# Patient Record
Sex: Male | Born: 1960 | Race: White | Hispanic: No | Marital: Single | Smoking: Current every day smoker
Health system: Southern US, Community
[De-identification: ages and names within clinical notes are randomized; demographics above are authoritative.]

## PROBLEM LIST (undated history)

## (undated) DIAGNOSIS — I10 Essential (primary) hypertension: Secondary | ICD-10-CM

## (undated) DIAGNOSIS — E785 Hyperlipidemia, unspecified: Secondary | ICD-10-CM

## (undated) DIAGNOSIS — I739 Peripheral vascular disease, unspecified: Secondary | ICD-10-CM

## (undated) DIAGNOSIS — E119 Type 2 diabetes mellitus without complications: Secondary | ICD-10-CM

## (undated) DIAGNOSIS — I2510 Atherosclerotic heart disease of native coronary artery without angina pectoris: Secondary | ICD-10-CM

## (undated) DIAGNOSIS — I471 Supraventricular tachycardia: Secondary | ICD-10-CM

## (undated) HISTORY — DX: Supraventricular tachycardia: I47.1

## (undated) HISTORY — DX: Hyperlipidemia, unspecified: E78.5

## (undated) HISTORY — PX: CARDIAC CATHETERIZATION: SHX172

## (undated) HISTORY — DX: Peripheral vascular disease, unspecified: I73.9

## (undated) HISTORY — PX: TONSILLECTOMY: SUR1361

## (undated) HISTORY — DX: Essential (primary) hypertension: I10

## (undated) HISTORY — DX: Type 2 diabetes mellitus without complications: E11.9

## (undated) HISTORY — DX: Atherosclerotic heart disease of native coronary artery without angina pectoris: I25.10

---

## 2004-07-30 ENCOUNTER — Encounter: Payer: Self-pay | Admitting: Cardiology

## 2008-07-29 ENCOUNTER — Encounter: Payer: Self-pay | Admitting: Cardiology

## 2008-09-15 ENCOUNTER — Encounter: Payer: Self-pay | Admitting: Cardiology

## 2008-11-03 DIAGNOSIS — R609 Edema, unspecified: Secondary | ICD-10-CM

## 2008-11-04 ENCOUNTER — Encounter: Payer: Self-pay | Admitting: Physician Assistant

## 2008-11-04 ENCOUNTER — Ambulatory Visit: Payer: Self-pay | Admitting: Cardiology

## 2008-11-04 DIAGNOSIS — I10 Essential (primary) hypertension: Secondary | ICD-10-CM

## 2008-11-04 DIAGNOSIS — I739 Peripheral vascular disease, unspecified: Secondary | ICD-10-CM

## 2008-11-04 DIAGNOSIS — E119 Type 2 diabetes mellitus without complications: Secondary | ICD-10-CM

## 2008-11-04 DIAGNOSIS — E785 Hyperlipidemia, unspecified: Secondary | ICD-10-CM

## 2008-11-07 ENCOUNTER — Encounter: Payer: Self-pay | Admitting: Cardiology

## 2008-11-07 ENCOUNTER — Encounter: Payer: Self-pay | Admitting: Physician Assistant

## 2008-11-10 ENCOUNTER — Ambulatory Visit: Payer: Self-pay | Admitting: Cardiovascular Disease

## 2008-11-10 ENCOUNTER — Inpatient Hospital Stay (HOSPITAL_BASED_OUTPATIENT_CLINIC_OR_DEPARTMENT_OTHER): Admission: RE | Admit: 2008-11-10 | Discharge: 2008-11-10 | Payer: Self-pay | Admitting: Cardiology

## 2008-11-15 ENCOUNTER — Ambulatory Visit: Payer: Self-pay | Admitting: Cardiovascular Disease

## 2008-11-15 ENCOUNTER — Observation Stay (HOSPITAL_COMMUNITY): Admission: AD | Admit: 2008-11-15 | Discharge: 2008-11-16 | Payer: Self-pay | Admitting: Cardiovascular Disease

## 2008-11-16 ENCOUNTER — Encounter: Payer: Self-pay | Admitting: Cardiology

## 2008-12-05 ENCOUNTER — Ambulatory Visit: Payer: Self-pay | Admitting: Cardiology

## 2009-09-19 ENCOUNTER — Encounter: Payer: Self-pay | Admitting: Cardiology

## 2010-04-27 NOTE — Medication Information (Signed)
Summary: RX Folder/ METOPROLOL  RX Folder/ METOPROLOL   Imported By: Dorise Hiss 09/19/2009 16:17:27  _____________________________________________________________________  External Attachment:    Type:   Image     Comment:   External Document

## 2010-06-08 ENCOUNTER — Encounter: Payer: Self-pay | Admitting: Cardiology

## 2010-06-12 NOTE — Letter (Addendum)
Summary: Medical Certification for CDL (DOT)  Medical Certification for CDL (DOT)   Imported By: Cyril Loosen, RN, BSN 06/08/2010 11:00:24  _____________________________________________________________________  External Attachment:    Type:   Image     Comment:   External Document  Appended Document: Medical Certification for CDL (DOT) Pt last seen in Sept of 2010. He was suppose to have 3 month follow up but did not schedule this appt.   Attempted to reach pt to notify that he will need office visit before this request can be completed. Pt's mailbox is full. Therefore, I was unable to leave a message. There was an option to send a number to pt's phone for call back. Office number entered requesting call back.   Appended Document: Medical Certification for CDL (DOT) Pt will be seen on Thursday, July 19, 2010 at 1:45pm. He is aware of this appt info and verbalized understanding.

## 2010-06-18 ENCOUNTER — Other Ambulatory Visit: Payer: Self-pay | Admitting: *Deleted

## 2010-06-18 DIAGNOSIS — I10 Essential (primary) hypertension: Secondary | ICD-10-CM

## 2010-06-18 MED ORDER — METOPROLOL TARTRATE 25 MG PO TABS: 25.0000 mg | ORAL_TABLET | Freq: Two times a day (BID) | ORAL | Status: DC

## 2010-06-30 LAB — CBC
HCT: 41.6 % (ref 39.0–52.0)
Hemoglobin: 14.3 g/dL (ref 13.0–17.0)
Platelets: 216 10*3/uL (ref 150–400)
RBC: 4.51 MIL/uL (ref 4.22–5.81)
RDW: 14.2 % (ref 11.5–15.5)
WBC: 5.6 10*3/uL (ref 4.0–10.5)
WBC: 6.4 10*3/uL (ref 4.0–10.5)

## 2010-06-30 LAB — BASIC METABOLIC PANEL
BUN: 5 mg/dL — ABNORMAL LOW (ref 6–23)
Calcium: 8.7 mg/dL (ref 8.4–10.5)
Creatinine, Ser: 0.8 mg/dL (ref 0.4–1.5)
GFR calc non Af Amer: >60 mL/min (ref >60)
GFR calc non Af Amer: >60 mL/min (ref >60)
Glucose, Bld: 166 mg/dL — ABNORMAL HIGH (ref 70–99)
Potassium: 4.4 mEq/L (ref 3.5–5.1)
Sodium: 136 mEq/L (ref 135–145)

## 2010-06-30 LAB — GLUCOSE, CAPILLARY
Glucose-Capillary: 136 mg/dL — ABNORMAL HIGH (ref 70–99)
Glucose-Capillary: 150 mg/dL — ABNORMAL HIGH (ref 70–99)
Glucose-Capillary: 158 mg/dL — ABNORMAL HIGH (ref 70–99)
Glucose-Capillary: 167 mg/dL — ABNORMAL HIGH (ref 70–99)

## 2010-07-18 ENCOUNTER — Encounter: Payer: Self-pay | Admitting: *Deleted

## 2010-07-19 ENCOUNTER — Encounter (INDEPENDENT_AMBULATORY_CARE_PROVIDER_SITE_OTHER): Payer: BC Managed Care – PPO | Admitting: Cardiology

## 2010-07-19 ENCOUNTER — Ambulatory Visit (INDEPENDENT_AMBULATORY_CARE_PROVIDER_SITE_OTHER): Payer: BC Managed Care – PPO | Admitting: Cardiology

## 2010-07-19 ENCOUNTER — Encounter: Payer: Self-pay | Admitting: Cardiology

## 2010-07-19 DIAGNOSIS — R0600 Dyspnea, unspecified: Secondary | ICD-10-CM | POA: Insufficient documentation

## 2010-07-19 DIAGNOSIS — F17200 Nicotine dependence, unspecified, uncomplicated: Secondary | ICD-10-CM

## 2010-07-19 DIAGNOSIS — Z720 Tobacco use: Secondary | ICD-10-CM

## 2010-07-19 DIAGNOSIS — I2510 Atherosclerotic heart disease of native coronary artery without angina pectoris: Secondary | ICD-10-CM

## 2010-07-19 DIAGNOSIS — R9430 Abnormal result of cardiovascular function study, unspecified: Secondary | ICD-10-CM | POA: Insufficient documentation

## 2010-07-19 DIAGNOSIS — R0609 Other forms of dyspnea: Secondary | ICD-10-CM

## 2010-07-19 MED ORDER — METOPROLOL TARTRATE 25 MG PO TABS: 25.0000 mg | ORAL_TABLET | Freq: Two times a day (BID) | ORAL | Status: DC

## 2010-07-19 NOTE — Assessment & Plan Note (Signed)
Edema is improved.  No change in therapy.  He will be continued on a higher dose of diuretics.

## 2010-07-19 NOTE — Progress Notes (Signed)
Pt did not bring medications to visit today and say's that all meds are the same. Patient informed to bring medication to each visit (per Carlye Grippe, LPN).   Pt's original visit notes made erroneous per Dr. Henrietta Hoover request w/new visit opened for this documentation.  The patient is seen today for followup coronary disease.  He's feeling well.  He is not having any significant chest pain.  He has no significant shortness of breath.  He received a drug-eluting stent to the ramus in August, 2010.  There is a 50% LAD lesion. Unfortunately the patient has started to smoke again.  Past Medical History  Diagnosis Date  . Dyspnea   . CAD (coronary artery disease)     50% LAD, DES ramus August, 2010  . Edema   . Ejection fraction     Normal EF at the time of catheterization 2010   Patient denies fever, chills, headache, sweats, rash, change in vision, change in hearing, chest pain, cough, nausea vomiting, urinary symptoms.  All other systems are reviewed and are negative.  Patient is oriented to person time and place.  Affect is normal.  Head is atraumatic.  There is no xanthelasma.  Lungs are clear.  Respiratory effort is unlabored.  Cardiac exam reveals S1-S2.  No clicks or significant murmurs.  The abdomen is soft.  There is no peripheral edema.  EKG is not done today.

## 2010-07-19 NOTE — Patient Instructions (Signed)
Your physician you to follow up in 1 year. You will receive a reminder letter in the mail one-two months in advance. If you don't receive a letter, please call our office to schedule the follow-up appointment. Your physician recommends that you continue on your current medications as directed. Please refer to the Current Medication list given to you today. Your physician discussed the hazards of tobacco use. Tobacco use cessation is recommended and techniques and options to help you quit were discussed. 

## 2010-07-19 NOTE — Progress Notes (Deleted)
HPI He feels much better with increased diuretics. Creatinine remains stable at 1.8.  The patient had an abdominal ultrasound.  This study showed cholelithiasis.  There was evidence of some fatty infiltration of the liver.  There was splenomegaly Allergies  Allergen Reactions  . Penicillins     REACTION: Unknown    Current Outpatient Prescriptions  Medication Sig Dispense Refill  . ALPRAZolam (XANAX) 0.5 MG tablet Take 0.5 mg by mouth daily as needed.        Marland Kitchen amLODipine-benazepril (LOTREL) 10-20 MG per capsule Take 1 capsule by mouth daily.        Marland Kitchen aspirin 325 MG tablet Take 325 mg by mouth daily.        Marland Kitchen buPROPion (WELLBUTRIN SR) 150 MG 12 hr tablet Take 150 mg by mouth 2 (two) times daily.        . carisoprodol (SOMA) 350 MG tablet Take 350 mg by mouth 2 (two) times daily as needed.        . clopidogrel (PLAVIX) 75 MG tablet Take 75 mg by mouth daily.        Marland Kitchen docusate sodium (COLACE) 50 MG capsule Take by mouth daily.        . furosemide (LASIX) 40 MG tablet Take 40 mg by mouth daily.        Marland Kitchen HYDROcodone-acetaminophen (VICODIN) 5-500 MG per tablet Take 1 tablet by mouth 2 (two) times daily as needed.        Marland Kitchen levothyroxine (SYNTHROID, LEVOTHROID) 125 MCG tablet Take 125 mcg by mouth daily.        . metFORMIN (GLUCOPHAGE) 500 MG tablet Take 1,000 mg by mouth 2 (two) times daily with a meal.        . metoprolol tartrate (LOPRESSOR) 25 MG tablet Take 1 tablet (25 mg total) by mouth 2 (two) times daily. Please call for office visit for future refills.  30 tablet  0  . niacin (NIASPAN) 500 MG CR tablet Take 500 mg by mouth at bedtime.        . nicotine (NICODERM CQ - DOSED IN MG/24 HOURS) 21 mg/24hr patch Place 1 patch onto the skin daily.        . nitroGLYCERIN (NITROSTAT) 0.4 MG SL tablet Place 0.4 mg under the tongue every 5 (five) minutes as needed.        . polycarbophil (FIBERCON) 625 MG tablet 6 tablets by mouth with meals       . tiZANidine (ZANAFLEX) 4 MG tablet Take 4 mg by  mouth at bedtime.          History   Social History  . Marital Status: Married    Spouse Name: N/A    Number of Children: N/A  . Years of Education: N/A   Occupational History  . Not on file.   Social History Main Topics  . Smoking status: Current Everyday Smoker -- 1.0 packs/day for 40 years    Types: Cigarettes  . Smokeless tobacco: Not on file  . Alcohol Use: Yes  . Drug Use: Not on file  . Sexually Active: Not on file   Other Topics Concern  . Not on file   Social History Narrative  . No narrative on file    No family history on file.  Past Medical History  Diagnosis Date  . Dyspnea   . CAD (coronary artery disease)     50% LAD, DES ramus August, 2010  . Edema   . Ejection fraction  Normal, catheterization, August, 2010  . Tobacco abuse     Past Surgical History  Procedure Date  . Tonsillectomy     ROS  Patient denies fever, chills, headache, sweats, rash, change in vision, change in hearing, chest pain, cough, nausea vomiting, urinary symptoms.  All other systems are reviewed and are negative.  PHYSICAL EXAM Patient is stable today.  He is oriented to person time and place.  Affect is normal.  He is here with his wife.  He is overweight.  There is no xanthelasma.  There is no carotid bruit.  There is no jugular venous distention.  Lungs are clear.  Respiratory effort is nonlabored.  Cardiac exam reveals S1-S2.  There are no clicks or significant murmurs.  The abdomen is soft.  There is no significant peripheral edema today. Filed Vitals:   07/19/10 1349  BP: 141/94  Pulse: 74  Height: 5\' 6"  (1.676 m)  Weight: 247 lb (112.038 kg)    EKG  EKG is not done today.  ASSESSMENT & PLAN

## 2010-07-19 NOTE — Assessment & Plan Note (Signed)
His breathing has improved significantly.  No change in therapy.

## 2010-07-19 NOTE — Progress Notes (Signed)
This encounter was created in error - please disregard.

## 2010-07-19 NOTE — Assessment & Plan Note (Signed)
The patient's creatinine has remained stable at 1.8 while his diuretic dose has been adjusted.  His potassium is stable also.  No change in therapy.

## 2010-07-19 NOTE — Assessment & Plan Note (Signed)
Coronary disease is stable. No further workup at this time. 

## 2010-07-19 NOTE — Assessment & Plan Note (Signed)
He's not having any significant dyspnea at this time.  No further workup.

## 2010-07-19 NOTE — Assessment & Plan Note (Signed)
Coronary disease is stable. No further workup is needed at this time. 

## 2010-07-19 NOTE — Assessment & Plan Note (Signed)
He had stopped smoking and then restarted.  I have counseled him again to stop smoking.

## 2010-07-19 NOTE — Progress Notes (Signed)
Patient did not bring medications to visit today and say's that all meds are still the same. Patient informed to bring medications to each visit.

## 2010-08-07 NOTE — Cardiovascular Report (Signed)
NAME:  Malik Booker, Malik Booker NO.:  [id number]   MEDICAL RECORD NO.:  [id number]          PATIENT TYPE:  OIB   LOCATION:  1961                         FACILITY:  [facility]   PHYSICIAN:  Veverly Fells. Excell Seltzer, MD  DATE OF BIRTH:  December 13, 1960   DATE OF PROCEDURE:  11/10/2008  DATE OF DISCHARGE:  11/10/2008                            CARDIAC CATHETERIZATION   PROCEDURES:  1. Left heart catheterization.  2. Selective coronary angiography.  3. Left ventricular angiography.  4. Abdominal aortic angiography.   PROCEDURAL INDICATIONS:  Malik Booker is a 50 year old gentleman with  multiple cardiovascular risk factors.  He presented with chest pain.  He  has had a stress test that showed no significant ischemia.  However, in  the setting of a high risk cardiac profile and persistent chest pain, he  was referred for cath.   Risks and indications of procedure were reviewed with the patient.  Informed consent was obtained.  The right groin was prepped, draped, and  anesthetized with 1% lidocaine.  Using modified Seldinger technique, a 4-  French sheath was placed in the right femoral artery.  Standard 4-French  Judkins catheters were used for coronary angiography and left  ventriculography.  The pigtail catheter was pulled back into the  abdominal aortogram, which was performed with a power injection.  The  patient tolerated the procedure well.  There were no immediate  complications.  All catheter exchanges were performed over guidewire.   FINDINGS:  Aortic pressure 131/75 with a mean of 98, left ventricular  pressure 132/20.   Coronary findings:  The left mainstem has mild lumen irregularities.  There are no significant stenoses.  The mainstem trifurcates into the  LAD, intermediate branch, and left circumflex.   LAD:  The LAD is a large-caliber vessel that wraps around the LV apex.  There is diffuse plaque throughout the proximal and mid LAD.  After the  first septal perforator,  there is diffuse 60-70% stenosis.  Further down  in the mid LAD, beyond a small diagonal branch, there is 50% stenosis.  The remaining portions of the mid and distal LAD have only mild diffuse  plaque, but no significant focal stenosis.   Ramus intermedius:  There is a severe eccentric stenosis just before the  intermediate branch divides into twin vessels.  This is a 90% stenosis.  It appears to be discrete.   Left circumflex:  The left circumflex is a large-caliber vessel.  The  circumflex is dominant.  It supplies a small first OM branch.  The  midportion of the AV groove circumflex has 30-40% diffuse stenosis.  The  circumflex then goes on to supply 2 posterolateral branches and a left  PDA branch.  There is mild stenosis leading into PDA branch, but there  are no areas of high-grade stenosis.   Right coronary artery of the RCA is nondominant.  It is diffusely  diseased with mild 30-50% stenosis throughout.  It supplies an RV  marginal branch as well as an acute marginal branch.   Left ventriculography:  LV function is normal.  The LVEF is estimated  at  55-60%.  There are no focal wall motion abnormalities present.   Abdominal aortic angiography.  There are patent renal arteries  bilaterally without stenosis.  The abdominal aorta is patent throughout  its course.  The proximal iliac arteries are both patent.   ASSESSMENT:  1. Severe ramus intermedius stenosis.  2. Moderately severe proximal and mid-left anterior descending      stenosis.  3. Nonobstructive left circumflex stenosis.  4. Nondominant right coronary artery with mild stenosis.  5. Normal left ventricular function.  6. Normal abdominal aorta.   PLAN:  I recommend to begin aspirin and Plavix.  We will schedule the  patient for percutaneous intervention of the ramus intermedius branch as  well as IVUS and possible PCI of the LAD.  The LAD stenosis is  indeterminate and I think it can be better assessed with either  IVUS or  FFR.  We will determine the best approach at the time of his  intervention next week.      Veverly Fells. Excell Seltzer, MD  Electronically Signed     MDC/MEDQ  D:  11/10/2008  T:  11/10/2008  Job:  [id number]   cc:   Malik Abed, MD, [facility]  Kirstie Peri, MD

## 2010-08-07 NOTE — Cardiovascular Report (Signed)
NAMEANTONE, SUMMONS NO.:  [id number]   MEDICAL RECORD NO.:  [id number]          PATIENT TYPE:  INP   LOCATION:  2504                         FACILITY:  [facility]   PHYSICIAN:  Veverly Fells. Excell Seltzer, MD  DATE OF BIRTH:  1960-11-03   DATE OF PROCEDURE:  11/15/2008  DATE OF DISCHARGE:                            CARDIAC CATHETERIZATION   PROCEDURE:  Percutaneous transluminal coronary angioplasty and stenting  of the ramus intermedius, Perclose of the right femoral artery.   INDICATIONS:  This is a 50 year old gentleman who presented with anginal  symptoms.  He underwent diagnostic catheterization that showed severe  stenosis of the ramus intermedius and moderate stenosis of the LAD.  He  was brought in today for percutaneous intervention.   Risks and indications of the procedure were reviewed with the patient.  Informed consent was obtained.  The right groin was prepped, draped, and  anesthetized with 1% lidocaine.  Using modified Seldinger technique, a 6-  French sheath was placed in the right femoral artery.  Angiomax was used  for anticoagulation.  The patient had been preloaded with Plavix.  A 6-  [place] XB LAD 3.5-cm guide catheter was inserted.  Initial angiographic  images were obtained.  The LAD stenosis appeared to be approximately 50%  and I felt that it could be treated medically.  The imaging with a 6-  [place] guide catheter was much better than the previous imaging with the  4-French diagnostic catheter.  There was a critical stenosis in the  midportion of the ramus intermedius branch just at a bifurcation point.  There was a 90% lesion involving that bifurcation.  The lesion was  crossed with an 0.014-inch guidewire.  The vessel was dilated with a 2.5  x 12-mm Apex balloon to 8 atmospheres.  It was then stented with a 2.5 x  15-mm Xience drug-eluting stent, which was taken to 14 atmospheres.  There is an excellent angiographic result with a well-expanded  stent.  The stented segment was postdilated with 2.75 x 12-mm [place] Apex balloon  which was dilated to 16 atmospheres.  At the completion of the  procedure, there was an excellent angiographic result with TIMI III flow  in the vessel.  The bifurcation point was crossed and the side branch  was not compromised.  The patient tolerated the procedure well.  There  were no immediate complications.  A Perclose device was used for femoral  artery hemostasis.   FINAL CONCLUSIONS:  Successful stenting of the ramus intermedius.   RECOMMENDATIONS:  Aspirin and Plavix for a minimum of 12 months.      Veverly Fells. Excell Seltzer, MD  Electronically Signed     MDC/MEDQ  D:  11/15/2008  T:  11/16/2008  Job:  [id number]   cc:   Luis Abed, MD, [facility]

## 2010-08-07 NOTE — Discharge Summary (Signed)
NAMEANTHONYMICHAEL, MUNDAY NO.:  [id number]   MEDICAL RECORD NO.:  [id number]          PATIENT TYPE:  INP   LOCATION:  2504                         FACILITY:  [facility]   PHYSICIAN:  Veverly Fells. Excell Seltzer, MD  DATE OF BIRTH:  May 16, 1960   DATE OF ADMISSION:  11/15/2008  DATE OF DISCHARGE:  11/16/2008                               DISCHARGE SUMMARY   PRIMARY CARDIOLOGIST:  Luis Abed, MD, [facility]   PRIMARY CARE PHYSICIAN:  Kirstie Peri, MD   DISCHARGE DIAGNOSIS:  Coronary artery disease.  A.  Cardiac cath on November 10, 2008:  1. Severe ramus intermedius stenosis.  2. Moderately severe proximal and mid left anterior descending      stenosis.  3. Nonobstructive left circumflex stenosis.  4. Nondominant right coronary artery with mild stenosis.  5. Normal left ventricular function.  6. Normal abdominal aorta.      a.     Cardiac cath and planned percutaneous coronary intervention       on November 15, 2008; successful stenting of the ramus intermedius       with Xience drug-eluting stent.   SECONDARY DIAGNOSES:  1. Dyslipidemia.      a.     Not on statin, presumably due to history of hepatitis.  2. Hypertension.  3. Type 2 diabetes mellitus.  4. History of intermittent claudication.  5. History of edema.  6. Ongoing tobacco abuse disorder.      a.     Currently smoking 2-1/2 packs per day, being discharged on       Wellbutrin and nicotine patch for smoking cessation.   ALLERGIES AND INTOLERANCES:  PENICILLIN.   PROCEDURES:  1. EKG on November 15, 2008:  Normal sinus rhythm, rate 67, and T-wave      inversion in lead III and T-wave flattening in aVF, otherwise no      acute ST-T wave changes, questionably significant Q-waves in lead      III, normal axis, no evidence of hypertrophy, intervals within      normal limits.  2. Cardiac catheterization and planned percutaneous transluminal      coronary angioplasty and percutaneous coronary intervention on      November 15, 2008:  Successful percutaneous transluminal coronary      angioplasty and percutaneous coronary intervention of the ramus      intermedius using a Xience drug-eluting stent..  3. EKG on November 16, 2008:  No significant changes prior tracing.   HISTORY OF PRESENT ILLNESS:  Mr. Panther is a 50 year old Caucasian male  who presented with anginal symptoms last week and underwent diagnostic  cardiac catheterization showing severe stenosis of the ramus intermedius  and moderate stenosis of the LAD.  He presents today for planned PTCA  and PCI.   HOSPITAL COURSE:  The patient was admitted and underwent procedures as  described above.  He tolerated them well without significant  complications.  The patient was enrolled in ADAPT DES and the PARS  registry clinical trials.  He worked with cardiac rehab and received  tobacco cessation counseling while inpatient.  He  was seen and assessed  by Dr. Tonny Bollman (his interventionalist) and deemed stable for  discharge.  The patient will follow up his primary cardiologist, Dr.  Myrtis Ser, in [place] on December 05, 2008, at 10:15 a.m.  The patient will be  discharged on full-strength aspirin and Plavix as well as nicotine patch  21 mg to be applied daily and Wellbutrin 150 mg b.i.d. for smoking  cessation.  At the time of discharge, the patient was given his new  medication list, prescriptions, followup instructions, and post-cath  instructions.  All questions and concerns were addressed at that time.   DISCHARGE LABORATORY DATA:  WBC 6.4, HGB 13.9, HCT 40.5, and PLT count  216.  Sodium 136, potassium 4.1, chloride 103, CO2 of 26, BUN 5,  creatinine 0.80, glucose 162, and calcium 8.7.   FOLLOWUP PLANS AND APPOINTMENTS:  Please see hospital course.   DISCHARGE MEDICATIONS:  Please see computer list.   DURATION OF DISCHARGE ENCOUNTER:  Approximately 35 minutes including  physician time.      Jarrett Ables, [facility]      Veverly Fells. Excell Seltzer, MD   Electronically Signed    MS/MEDQ  D:  11/16/2008  T:  11/17/2008  Job:  [id number]   cc:   Luis Abed, MD, [facility]  Kirstie Peri, MD

## 2011-07-22 ENCOUNTER — Encounter: Payer: Self-pay | Admitting: *Deleted

## 2011-07-22 ENCOUNTER — Ambulatory Visit (INDEPENDENT_AMBULATORY_CARE_PROVIDER_SITE_OTHER): Payer: BC Managed Care – PPO | Admitting: Cardiology

## 2011-07-22 ENCOUNTER — Encounter: Payer: Self-pay | Admitting: Cardiology

## 2011-07-22 ENCOUNTER — Telehealth: Payer: Self-pay | Admitting: *Deleted

## 2011-07-22 VITALS — BP 132/87 | HR 78 | Ht 66.0 in | Wt 258.0 lb

## 2011-07-22 DIAGNOSIS — R0600 Dyspnea, unspecified: Secondary | ICD-10-CM

## 2011-07-22 DIAGNOSIS — R0989 Other specified symptoms and signs involving the circulatory and respiratory systems: Secondary | ICD-10-CM

## 2011-07-22 DIAGNOSIS — Z720 Tobacco use: Secondary | ICD-10-CM

## 2011-07-22 DIAGNOSIS — Z01818 Encounter for other preprocedural examination: Secondary | ICD-10-CM

## 2011-07-22 DIAGNOSIS — I10 Essential (primary) hypertension: Secondary | ICD-10-CM

## 2011-07-22 DIAGNOSIS — I2510 Atherosclerotic heart disease of native coronary artery without angina pectoris: Secondary | ICD-10-CM

## 2011-07-22 DIAGNOSIS — F17200 Nicotine dependence, unspecified, uncomplicated: Secondary | ICD-10-CM

## 2011-07-22 NOTE — Assessment & Plan Note (Signed)
Clearance to drive a commercial vehicle:   The patient is stable. We will proceed with exercise testing. As long as there is no significant abnormality he can be cleared to drive.

## 2011-07-22 NOTE — Patient Instructions (Signed)
Your physician you to follow up in 1 year. You will receive a reminder letter in the mail one-two months in advance. If you don't receive a letter, please call our office to schedule the follow-up appointment. Your physician recommends that you continue on your current medications as directed. Please refer to the Current Medication list given to you today. Your physician has requested that you have a lexiscan myoview. For further information please visit [email or web address] Please follow instruction sheet, as given. If the results of your test are normal or stable, you will receive a letter. If they are abnormal, the nurse will contact you by phone.

## 2011-07-22 NOTE — Assessment & Plan Note (Signed)
Patient continues to smoke one half pack per day. I have counseled him to stop.

## 2011-07-22 NOTE — Telephone Encounter (Signed)
lexiscan myoview (weight) 258 Diag 414.00, v72.84, 786.09  Wednesday, May 1st, 2013  @ [facility]

## 2011-07-22 NOTE — Progress Notes (Signed)
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 He has known coronary disease. He's been stable. I saw him last in April, 2012. He's not having any chest pain or shortness of breath. He is overweight. In August, 2010, the patient had a 50% LAD lesion. He did receive a drug-eluting stent to the ramus branch at that time.  Allergies  Allergen Reactions  . Penicillins     REACTION: Unknown    Current Outpatient Prescriptions  Medication Sig Dispense Refill  . ALPRAZolam (XANAX) 0.5 MG tablet Take 0.5 mg by mouth 3 (three) times daily.       Marland Kitchen amLODipine-benazepril (LOTREL) 10-20 MG per capsule Take 1 capsule by mouth daily.        Marland Kitchen aspirin 325 MG tablet Take 325 mg by mouth daily.        . carisoprodol (SOMA) 350 MG tablet Take 350 mg by mouth 2 (two) times daily.       . clopidogrel (PLAVIX) 75 MG tablet Take 75 mg by mouth daily.        Marland Kitchen docusate sodium (COLACE) 50 MG capsule Take by mouth daily.        . furosemide (LASIX) 40 MG tablet Take 40 mg by mouth daily.        Marland Kitchen glimepiride (AMARYL) 4 MG tablet Take 1 tablet by mouth Daily.      Marland Kitchen HYDROcodone-acetaminophen (VICODIN) 5-500 MG per tablet Take 1 tablet by mouth 2 (two) times daily as needed.        Marland Kitchen ibuprofen (ADVIL,MOTRIN) 200 MG tablet Take 200 mg by mouth every 6 (six) hours as needed.      Marland Kitchen levothyroxine (SYNTHROID, LEVOTHROID) 175 MCG tablet Take 1 tablet by mouth Daily.      . metFORMIN (GLUCOPHAGE) 500 MG tablet Take 1,000 mg by mouth 2 (two) times daily with a meal.        . metoprolol tartrate (LOPRESSOR) 25 MG tablet Take 1 tablet (25 mg total) by mouth 2 (two) times daily.  60 tablet  12  . NIASPAN 1000 MG CR tablet Take 1 tablet by mouth Daily.      . nitroGLYCERIN (NITROSTAT) 0.4 MG SL tablet Place 0.4 mg under the tongue every 5 (five) minutes as needed.        . pantoprazole (PROTONIX) 40 MG tablet Take 1 tablet by mouth Daily.       Marland Kitchen PARoxetine (PAXIL) 20 MG tablet Take 10-20 mg by mouth Daily.       . polycarbophil (FIBERCON) 625 MG tablet 6 tablets by mouth with meals       . tiZANidine (ZANAFLEX) 4 MG tablet Take 4 mg by mouth at bedtime.        Marland Kitchen VICTOZA 18 MG/3ML SOLN Inject 1.8 mLs into the skin Daily.        History   Social History  . Marital Status: Unknown    Spouse Name: N/A    Number of Children: N/A  . Years of Education: N/A   Occupational History  . Not on file.   Social History Main Topics  . Smoking status: Current Everyday Smoker -- 1.0 packs/day for 40 years    Types: Cigarettes  . Smokeless tobacco: Never Used  . Alcohol Use: Yes  . Drug Use: Not on file  . Sexually Active: Not on file   Other Topics Concern  . Not  on file   Social History Narrative  . No narrative on file    No family history on file.  Past Medical History  Diagnosis Date  . Dyspnea   . CAD (coronary artery disease)     50% LAD, DES ramus August, 2010  . Edema   . Ejection fraction     Normal EF at the time of catheterization 2010  . Tobacco abuse     Past Surgical History  Procedure Date  . Tonsillectomy     ROS   Patient denies fever, chills, headache, sweats, rash, change in vision, change in hearing, chest pain, cough, nausea vomiting, urinary symptoms. All other systems are reviewed and are negative.  PHYSICAL EXAM  Patient is overweight. He is oriented to person time and place. Affect is normal. There is no jugulovenous distention. There no carotid bruits. Lungs are clear. Respiratory effort is nonlabored. Cardiac exam reveals S1 and S2. There no clicks or significant murmurs. The abdomen is soft. Is no peripheral edema. There no musculoskeletal deformities. There are no skin rashes.  Filed Vitals:   07/22/11 1259  BP: 132/87  Pulse: 78  Height: 5\' 6"  (1.676 m)  Weight: 258 lb (117.028 kg)   EKG is done today and reviewed by me. There is no significant abnormality.  ASSESSMENT &  PLAN

## 2011-07-22 NOTE — Assessment & Plan Note (Signed)
Coronary disease is stable. 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 He's had some mild claudication over time. Because of this we will not walking on the treadmill. He will have a Lexiscan Cardiolite study. We will get this done soon and be in touch with him about trying to get him cleared with the state so that he can drive.

## 2011-07-22 NOTE — Assessment & Plan Note (Signed)
Patient has mild chronic shortness of breath. No change. No further workup.

## 2011-07-22 NOTE — Assessment & Plan Note (Signed)
Blood pressure is controlled. No change in therapy. 

## 2011-07-22 NOTE — Telephone Encounter (Signed)
Auth # [id number] exp 08/20/11

## 2011-07-24 ENCOUNTER — Other Ambulatory Visit: Payer: Self-pay | Admitting: Cardiology

## 2011-07-24 DIAGNOSIS — R079 Chest pain, unspecified: Secondary | ICD-10-CM

## 2011-07-29 ENCOUNTER — Encounter: Payer: Self-pay | Admitting: Cardiology

## 2011-07-29 ENCOUNTER — Telehealth: Payer: Self-pay | Admitting: *Deleted

## 2011-07-29 NOTE — Telephone Encounter (Signed)
Message copied by Arlyss Gandy on Mon Jul 29, 2011  3:04 PM ------      Message from: Myrtis Ser, [place] D      Created: Mon Jul 29, 2011  1:26 PM       The patient's nuclear stress test is normal. 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 The patient had left a form with me in the office last week. I gave is to [place]. Please follow through as soon as possible to help him get this form so that he can turn it in very soon.

## 2011-07-29 NOTE — Telephone Encounter (Signed)
Pt notified of results and verbalized understanding. Faxed to [facility].

## 2011-12-20 ENCOUNTER — Encounter: Payer: Self-pay | Admitting: Cardiology

## 2011-12-20 NOTE — Progress Notes (Signed)
   The patient needs cardiac clearance for neurosurgery by Dr.Roy. I saw him earlier in 2013. He has mild coronary disease. He has a normal ejection fraction. He had a nuclear scan in April, 2013. There was no ischemia. Cardiac status is stable.  The patient is cleared from the cardiac viewpoint for neurosurgery.  Jerral Bonito, MD

## 2012-03-05 DIAGNOSIS — I4891 Unspecified atrial fibrillation: Secondary | ICD-10-CM

## 2012-03-05 DIAGNOSIS — I471 Supraventricular tachycardia: Secondary | ICD-10-CM

## 2012-03-06 DIAGNOSIS — I4891 Unspecified atrial fibrillation: Secondary | ICD-10-CM

## 2012-04-01 ENCOUNTER — Encounter: Payer: Self-pay | Admitting: Physician Assistant

## 2012-04-01 ENCOUNTER — Ambulatory Visit (INDEPENDENT_AMBULATORY_CARE_PROVIDER_SITE_OTHER): Payer: BC Managed Care – PPO | Admitting: Physician Assistant

## 2012-04-01 VITALS — BP 135/83 | HR 69 | Ht 67.0 in | Wt 238.0 lb

## 2012-04-01 DIAGNOSIS — F17200 Nicotine dependence, unspecified, uncomplicated: Secondary | ICD-10-CM

## 2012-04-01 DIAGNOSIS — I471 Supraventricular tachycardia: Secondary | ICD-10-CM | POA: Insufficient documentation

## 2012-04-01 DIAGNOSIS — Z720 Tobacco use: Secondary | ICD-10-CM

## 2012-04-01 DIAGNOSIS — I2510 Atherosclerotic heart disease of native coronary artery without angina pectoris: Secondary | ICD-10-CM

## 2012-04-01 DIAGNOSIS — I498 Other specified cardiac arrhythmias: Secondary | ICD-10-CM

## 2012-04-01 MED ORDER — ASPIRIN EC 81 MG PO TBEC: 81.0000 mg | DELAYED_RELEASE_TABLET | Freq: Every day | ORAL | Status: AC

## 2012-04-01 NOTE — Assessment & Plan Note (Addendum)
Quiescent on current medication regimen. Normal cardiac markers during recent hospitalization. Patient had a normal Lexiscan Cardiolite, 07/2011, as well as recent normal echocardiogram. Of note, will decrease ASA to 81 mg daily, given that patient is also on Plavix. He also reports easy bruisability.

## 2012-04-01 NOTE — Patient Instructions (Addendum)
   Decrease Aspirin to 81mg  daily Continue all other current medications. Follow up in  3 months

## 2012-04-01 NOTE — Progress Notes (Signed)
Primary Cardiologist: Jerral Bonito, MD   HPI: Post Booker followup from Malik Booker, status post recent consultation for postop SVT.   We felt this was most likely EAT, and found no evidence of atrial fibrillation/flutter. Electrolytes, magnesium, and TSH and NL. The arrhythmia reverted back to NSR, following treatment with IV Lopressor in the ED. We added Toprol-XL 25 twice a day, and reassessed LVF with echocardiogram, as follows:   - 2-D echocardiogram: EF 55-60%, no WMAs  Since his hospitalization, he cites 2 brief episodes of palpitations, each less than 1 minute duration, with no associated symptoms. He also suggests experiencing occasional palpitations over the course of last year, denying any recent increase in frequency.  He denies any exertional CP or significant DOE.  Allergies  Allergen Reactions  . Penicillins     REACTION: Unknown    Current Outpatient Prescriptions  Medication Sig Dispense Refill  . ALPRAZolam (XANAX) 0.5 MG tablet Take 0.5 mg by mouth 3 (three) times daily.       Marland Kitchen amLODipine-benazepril (LOTREL) 10-20 MG per capsule Take 1 capsule by mouth daily.        . carisoprodol (SOMA) 350 MG tablet Take 350 mg by mouth 2 (two) times daily.       . clopidogrel (PLAVIX) 75 MG tablet Take 75 mg by mouth daily.        Marland Kitchen docusate sodium (COLACE) 50 MG capsule Take by mouth daily.        . furosemide (LASIX) 40 MG tablet Take 40 mg by mouth daily.        Marland Kitchen glimepiride (AMARYL) 4 MG tablet Take 1 tablet by mouth Daily.      Marland Kitchen HYDROcodone-acetaminophen (VICODIN) 5-500 MG per tablet Take 1 tablet by mouth 2 (two) times daily as needed.        Marland Kitchen ibuprofen (ADVIL,MOTRIN) 200 MG tablet Take 200 mg by mouth every 6 (six) hours as needed.      Marland Kitchen levothyroxine (SYNTHROID, LEVOTHROID) 175 MCG tablet Take 1 tablet by mouth Daily.      . metFORMIN (GLUCOPHAGE) 500 MG tablet Take 1,000 mg by mouth 2 (two) times daily with a meal.        . metoprolol tartrate (LOPRESSOR) 25 MG tablet TAKE 1  TABLET TWICE DAILY.  60 tablet  6  . NIASPAN 1000 MG CR tablet Take 1 tablet by mouth Daily.      . nitroGLYCERIN (NITROSTAT) 0.4 MG SL tablet Place 0.4 mg under the tongue every 5 (five) minutes as needed.        . pantoprazole (PROTONIX) 40 MG tablet Take 1 tablet by mouth Daily.      Marland Kitchen PARoxetine (PAXIL) 20 MG tablet Take 10-20 mg by mouth Daily.       . polycarbophil (FIBERCON) 625 MG tablet 6 tablets by mouth with meals       . VICTOZA 18 MG/3ML SOLN Inject 1.8 mLs into the skin Daily.      Marland Kitchen aspirin EC 81 MG tablet Take 1 tablet (81 mg total) by mouth daily.        Past Medical History  Diagnosis Date  . Dyspnea   . CAD (coronary artery disease)     50% LAD, DES ramus August, 2010  . Edema   . Ejection fraction     Normal EF at the time of catheterization 2010  . Tobacco abuse   . Diabetes mellitus type II   . Dyslipidemia   . Hypertension   .  Claudication, intermittent   . Normal nuclear stress test     Normal nuclear stress test Jul 24, 2011  . Ectopic atrial tachycardia     Past Surgical History  Procedure Date  . Tonsillectomy   . Cardiac catheterization 11/10/08, 11/15/08    DRUG ELUTING STENT TO RAMUS INTERMEDIUS    History   Social History  . Marital Status: Unknown    Spouse Name: N/A    Number of Children: N/A  . Years of Education: N/A   Occupational History  . Not on file.   Social History Main Topics  . Smoking status: Former Smoker -- 1.0 packs/day for 40 years    Types: Cigarettes    Quit date: 01/21/2012  . Smokeless tobacco: Never Used  . Alcohol Use: Yes  . Drug Use: Not on file  . Sexually Active: Not on file   Other Topics Concern  . Not on file   Social History Narrative  . No narrative on file    No family history on file.  ROS: no nausea, vomiting; no fever, chills; no melena, hematochezia; small areas of bruising on both arms  PHYSICAL EXAM: BP 135/83  Pulse 69  Ht 5\' 7"  (1.702 m)  Wt 238 lb (107.956 kg)  BMI 37.28  kg/m2 GENERAL: 52 year-old male; NAD HEENT: NCAT, PERRLA, EOMI; sclera clear; no xanthelasma NECK: palpable bilateral carotid pulses, no bruits; no JVD; no TM LUNGS: CTA bilaterally CARDIAC: RRR (S1, S2); no significant murmurs; no rubs or gallops ABDOMEN: Protuberant EXTREMETIES: Trace peripheral edema SKIN: warm/dry; no obvious rash/lesions MUSCULOSKELETAL: no joint deformity NEURO: no focal deficit; NL affect   EKG:    ASSESSMENT & PLAN:  Ectopic atrial tachycardia Continue current dose beta blocker. Patient to contact our office for any increase in frequency or duration of these palpitations.  CAD (coronary artery disease) Quiescent on current medication regimen. Normal cardiac markers during recent hospitalization. Patient had a normal Lexiscan Cardiolite, 07/2011, as well as recent normal echocardiogram. Of note, will decrease ASA to 81 mg daily, given that patient is also on Plavix. He also reports easy bruisability.  Tobacco abuse It is now nearly 3 months since he stopped smoking.    Gene Chloe Flis, PAC

## 2012-04-01 NOTE — Assessment & Plan Note (Signed)
Continue current dose beta blocker. Patient to contact our office for any increase in frequency or duration of these palpitations.

## 2012-04-01 NOTE — Assessment & Plan Note (Signed)
It is now nearly 3 months since he stopped smoking.

## 2012-06-21 ENCOUNTER — Encounter: Payer: Self-pay | Admitting: Cardiology

## 2012-06-24 ENCOUNTER — Encounter: Payer: Self-pay | Admitting: Cardiology

## 2012-06-24 ENCOUNTER — Ambulatory Visit (INDEPENDENT_AMBULATORY_CARE_PROVIDER_SITE_OTHER): Payer: BC Managed Care – PPO | Admitting: Cardiology

## 2012-06-24 VITALS — BP 148/85 | HR 75 | Ht 66.0 in | Wt 246.0 lb

## 2012-06-24 DIAGNOSIS — I498 Other specified cardiac arrhythmias: Secondary | ICD-10-CM

## 2012-06-24 DIAGNOSIS — I471 Supraventricular tachycardia: Secondary | ICD-10-CM

## 2012-06-24 DIAGNOSIS — I10 Essential (primary) hypertension: Secondary | ICD-10-CM

## 2012-06-24 DIAGNOSIS — I2510 Atherosclerotic heart disease of native coronary artery without angina pectoris: Secondary | ICD-10-CM

## 2012-06-24 NOTE — Assessment & Plan Note (Signed)
His coronary status is stable. No further workup needed.

## 2012-06-24 NOTE — Patient Instructions (Signed)
Continue all current medications. Your physician wants you to follow up in:  1 year.  You will receive a reminder letter in the mail one-two months in advance.  If you don't receive a letter, please call our office to schedule the follow up appointment   

## 2012-06-24 NOTE — Assessment & Plan Note (Signed)
Blood pressures control. No change in therapy. 

## 2012-06-24 NOTE — Assessment & Plan Note (Addendum)
The patient has not had any further palpitations. It is of note that he has a short PR interval On the EKG done in the hospital. I've chosen not to repeat EKG today.    The rate is stable. No change in therapy.

## 2012-06-24 NOTE — Progress Notes (Signed)
HPI   Patient is seen to followup his overall cardiac status.  He admits seen in our office last January, 2014. That visit was a post hospital visit. He had some surgery on his neck. In the hospital he had some SVT. His echo showed that his LV function was normal. His rhythm stabilized. He was placed back on his medicines and everything stabilize. He has not had any significant palpitations since that time.  He is slowly recovering from his neck surgery.  Allergies  Allergen Reactions  . Penicillins     REACTION: Unknown    Current Outpatient Prescriptions  Medication Sig Dispense Refill  . ALPRAZolam (XANAX) 0.5 MG tablet Take 0.5 mg by mouth 3 (three) times daily.       Marland Kitchen amLODipine-benazepril (LOTREL) 10-20 MG per capsule Take 1 capsule by mouth daily.        Marland Kitchen aspirin EC 81 MG tablet Take 1 tablet (81 mg total) by mouth daily.      . carisoprodol (SOMA) 350 MG tablet Take 350 mg by mouth 2 (two) times daily.       Rhea Bleacher CONTINUING MONTH PAK 1 MG tablet Take 1 mg by mouth 2 (two) times daily.       . clopidogrel (PLAVIX) 75 MG tablet Take 75 mg by mouth daily.        . cyclobenzaprine (FLEXERIL) 10 MG tablet Take 10 mg by mouth 2 (two) times daily as needed.       . docusate sodium (COLACE) 50 MG capsule Take by mouth daily.        . furosemide (LASIX) 40 MG tablet Take 40 mg by mouth daily.        Marland Kitchen glimepiride (AMARYL) 4 MG tablet Take 1 tablet by mouth Daily.      Marland Kitchen HYDROcodone-acetaminophen (VICODIN) 5-500 MG per tablet Take 1 tablet by mouth 2 (two) times daily as needed.        Marland Kitchen ibuprofen (ADVIL,MOTRIN) 200 MG tablet Take 200 mg by mouth every 6 (six) hours as needed.      Marland Kitchen levothyroxine (SYNTHROID, LEVOTHROID) 175 MCG tablet Take 1 tablet by mouth Daily.      . metFORMIN (GLUCOPHAGE) 500 MG tablet Take 1,000 mg by mouth 2 (two) times daily with a meal.        . metoprolol tartrate (LOPRESSOR) 25 MG tablet TAKE 1 TABLET TWICE DAILY.  60 tablet  6  . NIASPAN 1000 MG CR  tablet Take 1 tablet by mouth Daily.      . nitroGLYCERIN (NITROSTAT) 0.4 MG SL tablet Place 0.4 mg under the tongue every 5 (five) minutes as needed.        . pantoprazole (PROTONIX) 40 MG tablet Take 1 tablet by mouth Daily.      Marland Kitchen PARoxetine (PAXIL) 20 MG tablet Take 10-20 mg by mouth Daily.       . polycarbophil (FIBERCON) 625 MG tablet 6 tablets by mouth with meals       . pravastatin (PRAVACHOL) 20 MG tablet Take 20 mg by mouth daily.       Marland Kitchen VICTOZA 18 MG/3ML SOLN Inject 1.8 mLs into the skin Daily.       No current facility-administered medications for this visit.    History   Social History  . Marital Status: Unknown    Spouse Name: N/A    Number of Children: N/A  . Years of Education: N/A   Occupational History  . Not on  file.   Social History Main Topics  . Smoking status: Former Smoker -- 1.00 packs/day for 40 years    Types: Cigarettes    Quit date: 01/21/2012  . Smokeless tobacco: Never Used  . Alcohol Use: Yes  . Drug Use: Not on file  . Sexually Active: Not on file   Other Topics Concern  . Not on file   Social History Narrative  . No narrative on file    No family history on file.  Past Medical History  Diagnosis Date  . Dyspnea   . CAD (coronary artery disease)     50% LAD, DES ramus August, 2010  . Edema   . Ejection fraction     Normal EF at the time of catheterization 2010  . Tobacco abuse   . Diabetes mellitus type II   . Dyslipidemia   . Hypertension   . Claudication, intermittent   . Normal nuclear stress test     Normal nuclear stress test Jul 24, 2011  . Ectopic atrial tachycardia     Past Surgical History  Procedure Laterality Date  . Tonsillectomy    . Cardiac catheterization  11/10/08, 11/15/08    DRUG ELUTING STENT TO RAMUS INTERMEDIUS    Patient Active Problem List  Diagnosis  . DIAB W/O COMP TYPE II/UNS NOT STATED UNCNTRL  . DYSLIPIDEMIA  . ESSENTIAL HYPERTENSION, BENIGN  . CLAUDICATION, INTERMITTENT  . EDEMA  .  Dyspnea  . Ejection fraction  . CAD (coronary artery disease)  . Tobacco abuse  . Preoperative clearance  . Normal nuclear stress test  . Ectopic atrial tachycardia    ROS   Patient denies fever, chills, headache, sweats, rash, change in vision, change in hearing, chest pain, cough, nausea vomiting, urinary symptoms. All other systems are reviewed and are negative.  PHYSICAL EXAM  Patient is oriented to person time and place. Affect is normal. There is no jugulovenous distention. Lungs are clear. Respiratory effort is nonlabored. Cardiac exam reveals S1 and S2. There no clicks or significant murmurs. The abdomen is soft. There is no peripheral edema.  Filed Vitals:   06/24/12 1028  BP: 148/85  Pulse: 75  Height: 5\' 6"  (1.676 m)  Weight: 246 lb (111.585 kg)   I have reviewed the EKG from December, 2013. The PR interval is short. The rhythm is stable.  ASSESSMENT & PLAN

## 2012-11-11 ENCOUNTER — Other Ambulatory Visit: Payer: Self-pay | Admitting: Physician Assistant

## 2013-02-17 ENCOUNTER — Other Ambulatory Visit: Payer: Self-pay | Admitting: Neurosurgery

## 2013-02-17 DIAGNOSIS — M4712 Other spondylosis with myelopathy, cervical region: Secondary | ICD-10-CM

## 2013-02-26 ENCOUNTER — Ambulatory Visit
Admission: RE | Admit: 2013-02-26 | Discharge: 2013-02-26 | Disposition: A | Discharge status: Home / Self Care | Payer: BC Managed Care – PPO | Source: Ambulatory Visit | Attending: Neurosurgery | Admitting: Neurosurgery

## 2013-02-26 DIAGNOSIS — M4712 Other spondylosis with myelopathy, cervical region: Secondary | ICD-10-CM

## 2013-08-20 ENCOUNTER — Other Ambulatory Visit: Payer: Self-pay | Admitting: Cardiology

## 2013-09-22 ENCOUNTER — Other Ambulatory Visit: Payer: Self-pay | Admitting: Cardiology

## 2013-10-22 ENCOUNTER — Other Ambulatory Visit: Payer: Self-pay | Admitting: Cardiology

## 2013-11-17 ENCOUNTER — Encounter: Payer: Self-pay | Admitting: Cardiology

## 2013-11-17 ENCOUNTER — Ambulatory Visit (INDEPENDENT_AMBULATORY_CARE_PROVIDER_SITE_OTHER): Payer: BC Managed Care – PPO | Admitting: Cardiology

## 2013-11-17 VITALS — BP 124/88 | HR 79 | Ht 66.0 in | Wt 234.0 lb

## 2013-11-17 DIAGNOSIS — I498 Other specified cardiac arrhythmias: Secondary | ICD-10-CM | POA: Diagnosis not present

## 2013-11-17 DIAGNOSIS — R0989 Other specified symptoms and signs involving the circulatory and respiratory systems: Secondary | ICD-10-CM | POA: Insufficient documentation

## 2013-11-17 DIAGNOSIS — Z720 Tobacco use: Secondary | ICD-10-CM

## 2013-11-17 DIAGNOSIS — I2510 Atherosclerotic heart disease of native coronary artery without angina pectoris: Secondary | ICD-10-CM

## 2013-11-17 DIAGNOSIS — E785 Hyperlipidemia, unspecified: Secondary | ICD-10-CM | POA: Diagnosis not present

## 2013-11-17 DIAGNOSIS — I471 Supraventricular tachycardia: Secondary | ICD-10-CM

## 2013-11-17 DIAGNOSIS — I10 Essential (primary) hypertension: Secondary | ICD-10-CM | POA: Diagnosis not present

## 2013-11-17 DIAGNOSIS — F17200 Nicotine dependence, unspecified, uncomplicated: Secondary | ICD-10-CM

## 2013-11-17 MED ORDER — ATORVASTATIN CALCIUM 40 MG PO TABS
40.0000 mg | ORAL_TABLET | Freq: Every day | ORAL | Status: AC
Start: 2013-11-17 — End: ?

## 2013-11-17 NOTE — Assessment & Plan Note (Signed)
The patient is on Pravachol. Guidelines call for the use of higher dose medications. I will be recommending atorvastatin 40 mg. He is considering.

## 2013-11-17 NOTE — Assessment & Plan Note (Signed)
Coronary disease is stable. He had a nuclear study in May, 2013 and there was no ischemia.

## 2013-11-17 NOTE — Assessment & Plan Note (Signed)
He continues to smoke significantly. I had an extensive discussion with him explaining all the benefits of stopping at this point.

## 2013-11-17 NOTE — Progress Notes (Signed)
Patient ID: Malik Booker, male   DOB: Jul 31, 1960, 53 y.o.   MRN: [id number]    HPI  Patient is seen today to followup coronary disease. Unfortunately he continues to smoke. I had a long discussion with him about this. He's not had any significant chest pain or palpitations.  Allergies  Allergen Reactions  . Penicillins     REACTION: Unknown    Current Outpatient Prescriptions  Medication Sig Dispense Refill  . ALPRAZolam (XANAX) 0.5 MG tablet Take 0.5 mg by mouth 3 (three) times daily.       Marland Kitchen amLODipine-benazepril (LOTREL) 10-20 MG per capsule Take 1 capsule by mouth daily.        Marland Kitchen aspirin EC 81 MG tablet Take 1 tablet (81 mg total) by mouth daily.      . Canagliflozin (INVOKANA) 300 MG TABS Take 1 tablet by mouth daily.      . clopidogrel (PLAVIX) 75 MG tablet Take 75 mg by mouth daily.        . cyclobenzaprine (FLEXERIL) 10 MG tablet Take 10 mg by mouth 2 (two) times daily as needed.       . docusate sodium (COLACE) 50 MG capsule Take by mouth daily.       . furosemide (LASIX) 40 MG tablet Take 40 mg by mouth daily.        Marland Kitchen glimepiride (AMARYL) 4 MG tablet Take 1 tablet by mouth Daily.      Marland Kitchen HYDROcodone-acetaminophen (NORCO/VICODIN) 5-325 MG per tablet Take 1 tablet by mouth every 6 (six) hours as needed for moderate pain.      Marland Kitchen ibuprofen (ADVIL,MOTRIN) 200 MG tablet Take 200 mg by mouth every 6 (six) hours as needed.      Marland Kitchen levothyroxine (SYNTHROID, LEVOTHROID) 175 MCG tablet Take 1 tablet by mouth Daily.      . metFORMIN (GLUCOPHAGE) 500 MG tablet Take 1,000 mg by mouth 2 (two) times daily with a meal.        . metoprolol succinate (TOPROL-XL) 25 MG 24 hr tablet TAKE ONE TABLET BY MOUTH TWICE DAILY.  60 tablet  0  . NIASPAN 1000 MG CR tablet Take 1 tablet by mouth Daily.      . nitroGLYCERIN (NITROSTAT) 0.4 MG SL tablet Place 0.4 mg under the tongue every 5 (five) minutes as needed.        . pantoprazole (PROTONIX) 40 MG tablet Take 1 tablet by mouth Daily.      . polycarbophil  (FIBERCON) 625 MG tablet 6 tablets by mouth with meals       . pravastatin (PRAVACHOL) 20 MG tablet Take 20 mg by mouth daily.        No current facility-administered medications for this visit.    History   Social History  . Marital Status: Unknown    Spouse Name: N/A    Number of Children: N/A  . Years of Education: N/A   Occupational History  . Not on file.   Social History Main Topics  . Smoking status: Current Every Day Smoker -- 1.50 packs/day for 40 years    Types: Cigarettes    Start date: 04/14/1970  . Smokeless tobacco: Never Used  . Alcohol Use: Yes  . Drug Use: Not on file  . Sexual Activity: Not on file   Other Topics Concern  . Not on file   Social History Narrative  . No narrative on file    No family history on file.  Past Medical History  Diagnosis  Date  . Dyspnea   . CAD (coronary artery disease)     50% LAD, DES ramus August, 2010  . Edema   . Ejection fraction     Normal EF at the time of catheterization 2010  . Tobacco abuse   . Diabetes mellitus type II   . Dyslipidemia   . Hypertension   . Claudication, intermittent   . Normal nuclear stress test     Normal nuclear stress test Jul 24, 2011  . Ectopic atrial tachycardia     Past Surgical History  Procedure Laterality Date  . Tonsillectomy    . Cardiac catheterization  11/10/08, 11/15/08    DRUG ELUTING STENT TO RAMUS INTERMEDIUS    Patient Active Problem List   Diagnosis Date Noted  . Ectopic atrial tachycardia   . Dyspnea   . Ejection fraction   . CAD (coronary artery disease)   . Tobacco abuse   . DIAB W/O COMP TYPE II/UNS NOT STATED UNCNTRL 11/04/2008  . DYSLIPIDEMIA 11/04/2008  . ESSENTIAL HYPERTENSION, BENIGN 11/04/2008  . CLAUDICATION, INTERMITTENT 11/04/2008  . EDEMA 11/03/2008    ROS   Patient denies fever, chills, headache, sweats, rash, change in vision, change in hearing, chest pain, cough, nausea or vomiting, urinary symptoms. All other systems are reviewed and  are negative.  PHYSICAL EXAM  Patient is oriented to person time and place. He is overweight. Head is atraumatic. Sclera and conjunctiva are normal. There is no jugulovenous distention. There is a right carotid bruit. Lungs are clear. Respiratory effort is nonlabored. Cardiac exam reveals S1 and S2. The abdomen is soft. There is no peripheral edema. There no musculoskeletal deformities. There are no skin rashes.  Filed Vitals:   11/17/13 0918  BP: 124/88  Pulse: 79  Height: 5\' 6"  (1.676 m)  Weight: 234 lb (106.142 kg)   EKG is done today and reviewed by me. There is normal sinus rhythm. EKG is normal.  ASSESSMENT & PLAN

## 2013-11-17 NOTE — Assessment & Plan Note (Signed)
He has not had any significant recurrent palpitations. No further workup.

## 2013-11-17 NOTE — Assessment & Plan Note (Addendum)
The patient is a right carotid bruit. He has known vascular disease. He continues to smoke. I have no documentation of prior carotid Dopplers. This will be arranged for him in the near future.  As part of today's evaluation I spent greater than 25 minutes for this total care. More than half of this time is been with direct discussion with him talking about smoking and his lipids and his carotid arteries.

## 2013-11-17 NOTE — Patient Instructions (Signed)
Your physician recommends that you schedule a follow-up appointment in: 1 year. You will receive a reminder letter in the mail in about 10 months reminding you to call and schedule your appointment. If you don't receive this letter, please contact our office. Your physician has recommended you make the following change in your medication:  Stop pravastatin. Start atorvastatin 40 mg daily. Continue all other medications the same. Your physician has requested that you have a carotid duplex. This test is an ultrasound of the carotid arteries in your neck. It looks at blood flow through these arteries that supply the brain with blood. Allow one hour for this exam. There are no restrictions or special instructions.

## 2013-11-17 NOTE — Assessment & Plan Note (Signed)
Blood pressures control. No change in therapy. 

## 2013-11-22 ENCOUNTER — Other Ambulatory Visit: Payer: Self-pay | Admitting: Cardiology

## 2013-11-25 ENCOUNTER — Encounter (INDEPENDENT_AMBULATORY_CARE_PROVIDER_SITE_OTHER): Payer: BC Managed Care – PPO

## 2013-11-25 DIAGNOSIS — I6529 Occlusion and stenosis of unspecified carotid artery: Secondary | ICD-10-CM

## 2013-11-25 DIAGNOSIS — R0989 Other specified symptoms and signs involving the circulatory and respiratory systems: Secondary | ICD-10-CM

## 2013-11-26 ENCOUNTER — Encounter: Payer: Self-pay | Admitting: Cardiology

## 2013-11-26 DIAGNOSIS — I779 Disorder of arteries and arterioles, unspecified: Secondary | ICD-10-CM | POA: Insufficient documentation

## 2013-11-26 DIAGNOSIS — I739 Peripheral vascular disease, unspecified: Secondary | ICD-10-CM

## 2013-11-30 ENCOUNTER — Telehealth: Payer: Self-pay | Admitting: *Deleted

## 2013-11-30 NOTE — Telephone Encounter (Signed)
Patient informed. 

## 2013-11-30 NOTE — Telephone Encounter (Signed)
Message copied by Merlene Laughter on Tue Nov 30, 2013  2:24 PM ------      Message from: [place],  D      Created: Fri Nov 26, 2013  4:58 PM       Please let the patient know that the carotid Dopplers do show some mild narrowing of his carotid arteries. This is mild and can be safely followed over time. ------

## 2014-06-27 ENCOUNTER — Other Ambulatory Visit: Payer: Self-pay | Admitting: Cardiology

## 2014-08-16 IMAGING — CR DG CERVICAL SPINE FLEX&EXT ONLY
3 series · 3 of 3 positions shown · non-contrast
Comparison: MRI same day, radiography 04/13/2012

CLINICAL DATA: Left-sided neck pain since surgery.

EXAM:
CERVICAL SPINE - FLEXION AND EXTENSION VIEWS ONLY

[view not recorded (1 of 3)]
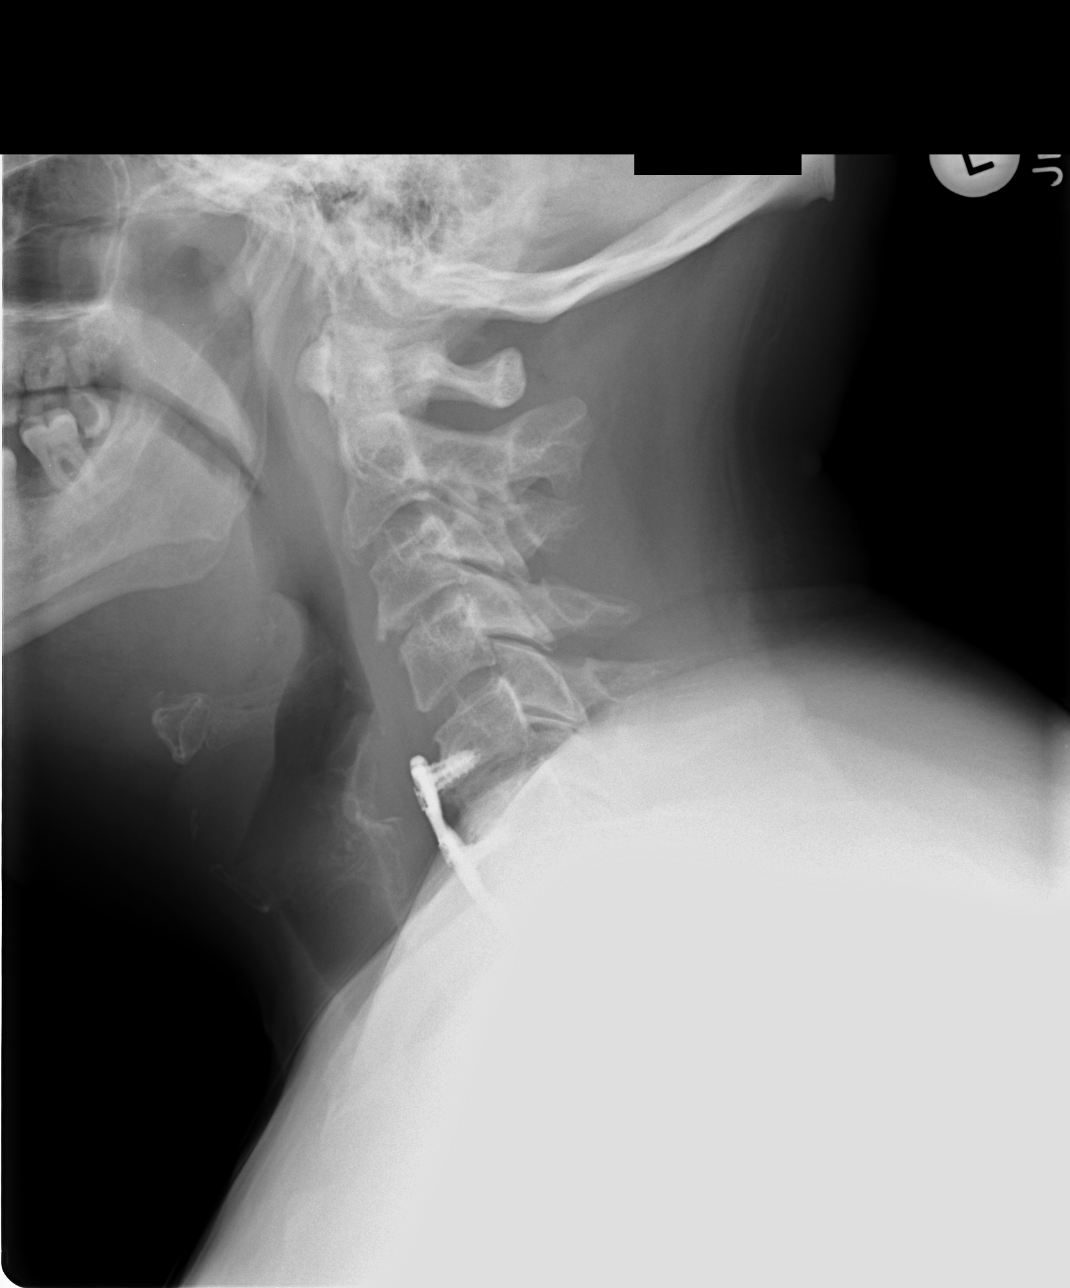

[view not recorded (2 of 3)]
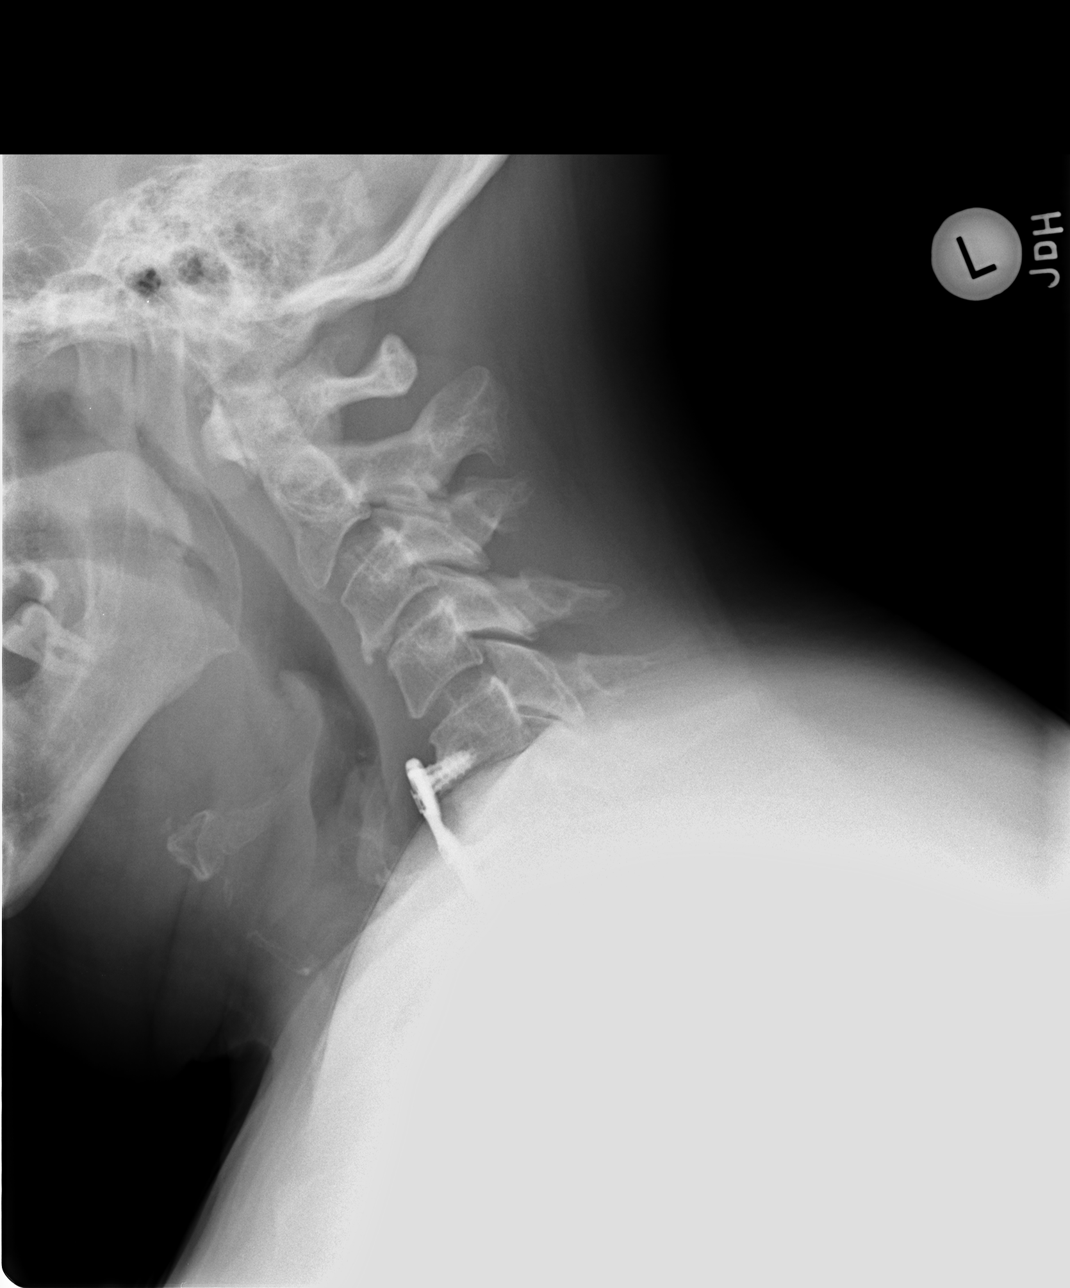

[view not recorded (3 of 3)]
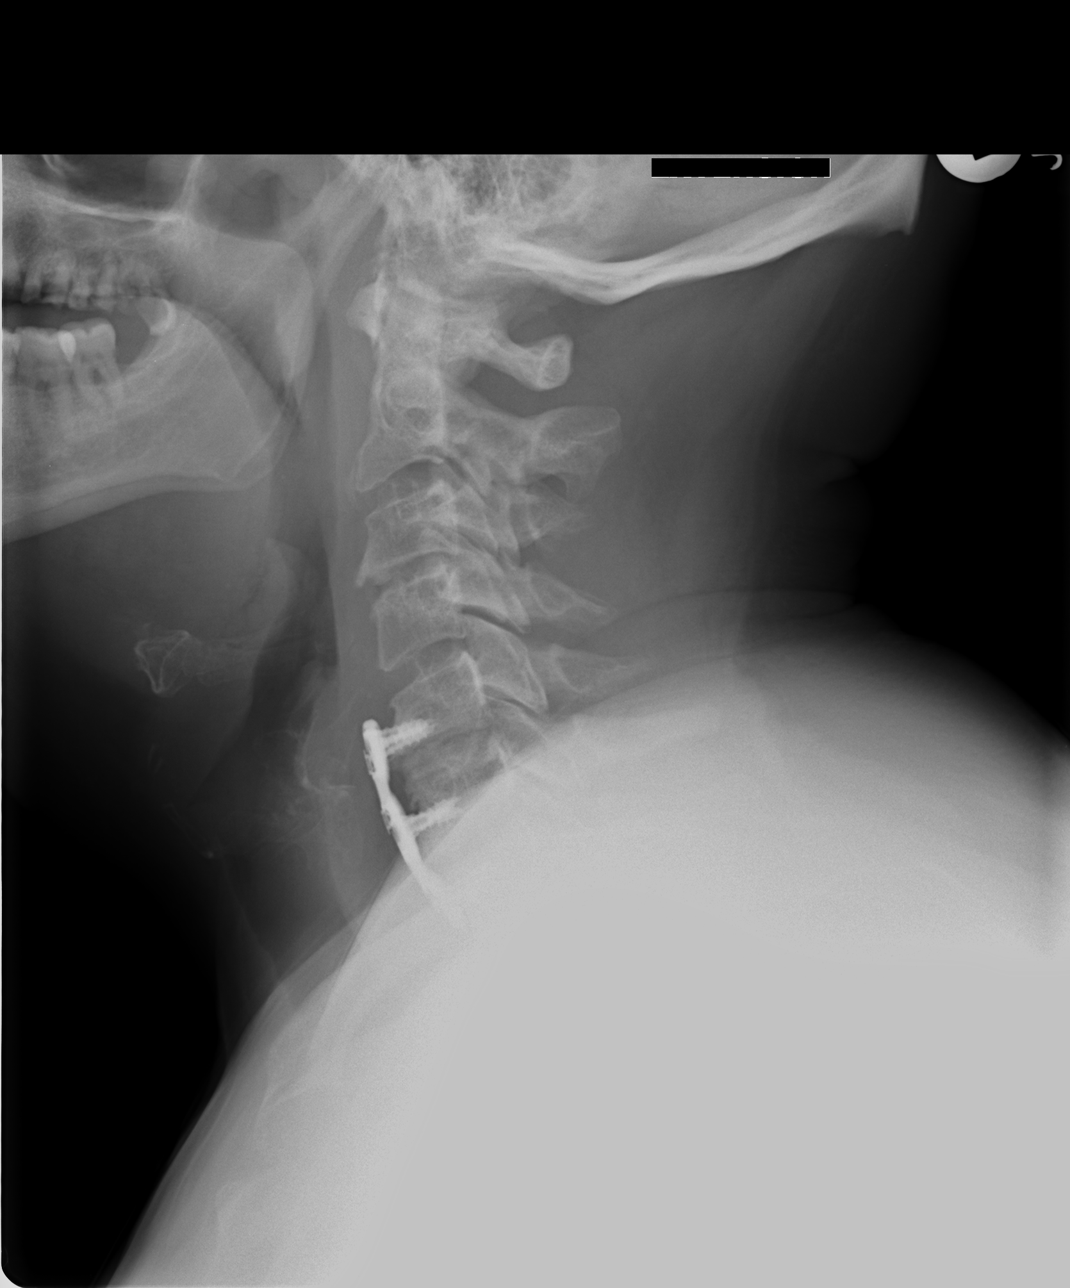

[3 of 3 positions shown; findings below may reference images not displayed]

FINDINGS: There has been previous anterior cervical discectomy and fusion from
C5-C7. The lower portion is poorly seen because of shoulder density.
Flexion-extension views do not show any detectable motion in the
fusion segment. Plate is slightly anterior, but this is chronic and
fixed. There is chronic spondylosis at C3-4 with disc space
narrowing and small marginal osteophytes. There is ordinary
osteoarthritis of the C1-2 articulation.
IMPRESSION: No apparent change since the previous study. ACDF C5-C7 without
evidence of motion. Plate is slightly anterior, but this is chronic.

Chronic degenerative changes above the fusion region.

## 2014-10-22 ENCOUNTER — Other Ambulatory Visit: Payer: Self-pay | Admitting: Cardiology

## 2014-11-23 ENCOUNTER — Other Ambulatory Visit: Payer: Self-pay | Admitting: Cardiology

## 2014-12-07 ENCOUNTER — Encounter: Payer: Self-pay | Admitting: Cardiology

## 2014-12-07 ENCOUNTER — Ambulatory Visit (INDEPENDENT_AMBULATORY_CARE_PROVIDER_SITE_OTHER): Payer: BLUE CROSS/BLUE SHIELD | Admitting: Cardiology

## 2014-12-07 VITALS — BP 132/78 | HR 74 | Ht 66.0 in | Wt 232.0 lb

## 2014-12-07 DIAGNOSIS — I2510 Atherosclerotic heart disease of native coronary artery without angina pectoris: Secondary | ICD-10-CM | POA: Diagnosis not present

## 2014-12-07 DIAGNOSIS — Z720 Tobacco use: Secondary | ICD-10-CM | POA: Diagnosis not present

## 2014-12-07 DIAGNOSIS — E785 Hyperlipidemia, unspecified: Secondary | ICD-10-CM | POA: Diagnosis not present

## 2014-12-07 DIAGNOSIS — I779 Disorder of arteries and arterioles, unspecified: Secondary | ICD-10-CM

## 2014-12-07 DIAGNOSIS — I739 Peripheral vascular disease, unspecified: Secondary | ICD-10-CM

## 2014-12-07 NOTE — Patient Instructions (Signed)

## 2014-12-07 NOTE — Progress Notes (Signed)
Cardiology Office Note   Date:  12/07/2014   ID:  Malik Booker, DOB [birth year], MRN [id number]  PCP:  Monico Blitz, MD  Cardiologist:  Dola Argyle, MD   Chief Complaint  Patient presents with  . Appointment    Follow-up coronary artery disease      History of Present Illness: Malik Booker is a 54 y.o. male who presents today to follow-up coronary artery disease. He is using Chantix. His stop smoking date is within the next 2 weeks. He seems motivated. He is not having any significant chest pain or shortness of breath. He underwent nuclear stress testing in 2013 with no significant ischemia. We were able to convince him to change from Pravachol to atorvastatin. This occurred last year. Since seeing him in August, 2015, he had a carotid Doppler showing mild bilateral disease. His next Doppler can be done a year from now.    Past Medical History  Diagnosis Date  . Dyspnea   . CAD (coronary artery disease)     50% LAD, DES ramus August, 2010  . Edema   . Ejection fraction     Normal EF at the time of catheterization 2010  . Tobacco abuse   . Diabetes mellitus type II   . Dyslipidemia   . Hypertension   . Claudication, intermittent   . Normal nuclear stress test     Normal nuclear stress test Jul 24, 2011  . Ectopic atrial tachycardia     Past Surgical History  Procedure Laterality Date  . Tonsillectomy    . Cardiac catheterization  11/10/08, 11/15/08    DRUG ELUTING STENT TO RAMUS INTERMEDIUS    Patient Active Problem List   Diagnosis Date Noted  . Carotid artery disease without cerebral infarction 11/26/2013  . Right carotid bruit 11/17/2013  . Ectopic atrial tachycardia   . Dyspnea   . Ejection fraction   . CAD (coronary artery disease)   . Tobacco abuse   . DIAB W/O COMP TYPE II/UNS NOT STATED UNCNTRL 11/04/2008  . DYSLIPIDEMIA 11/04/2008  . ESSENTIAL HYPERTENSION, BENIGN 11/04/2008  . CLAUDICATION, INTERMITTENT 11/04/2008  . EDEMA 11/03/2008      Current  Outpatient Prescriptions  Medication Sig Dispense Refill  . amLODipine-benazepril (LOTREL) 10-20 MG per capsule Take 1 capsule by mouth daily.      Marland Kitchen aspirin EC 81 MG tablet Take 1 tablet (81 mg total) by mouth daily.    Marland Kitchen atorvastatin (LIPITOR) 40 MG tablet Take 1 tablet (40 mg total) by mouth daily. 90 tablet 3  . Canagliflozin (INVOKANA) 300 MG TABS Take 1 tablet by mouth daily.    . clonazePAM (KLONOPIN) 0.5 MG tablet Take 0.5 mg by mouth 3 (three) times daily.    . clopidogrel (PLAVIX) 75 MG tablet Take 75 mg by mouth daily.      . cyclobenzaprine (FLEXERIL) 10 MG tablet Take 10 mg by mouth 3 (three) times daily.     Marland Kitchen docusate sodium (COLACE) 50 MG capsule Take by mouth daily.     . fenofibrate (TRICOR) 145 MG tablet Take 145 mg by mouth daily.    . furosemide (LASIX) 40 MG tablet Take 40 mg by mouth daily.      Marland Kitchen HYDROcodone-acetaminophen (NORCO/VICODIN) 5-325 MG per tablet Take 1 tablet by mouth every 6 (six) hours as needed for moderate pain.    Marland Kitchen ibuprofen (ADVIL,MOTRIN) 200 MG tablet Take 200 mg by mouth every 6 (six) hours as needed.    Marland Kitchen levothyroxine (SYNTHROID, LEVOTHROID) 200  MCG tablet Take 200 mcg by mouth daily.    . metFORMIN (GLUCOPHAGE) 500 MG tablet Take 1,000 mg by mouth 2 (two) times daily with a meal.      . metoprolol succinate (TOPROL-XL) 25 MG 24 hr tablet TAKE ONE TABLET BY MOUTH TWICE DAILY. 60 tablet 0  . NIASPAN 1000 MG CR tablet Take 1 tablet by mouth Daily.    . pantoprazole (PROTONIX) 40 MG tablet Take 1 tablet by mouth Daily.    . varenicline (CHANTIX) 1 MG tablet Take 1 mg by mouth 2 (two) times daily.     No current facility-administered medications for this visit.    Allergies:   Penicillins    Social History:  The patient  reports that he has been smoking Cigarettes.  He started smoking about 44 years ago. He has a 60 pack-year smoking history. He has never used smokeless tobacco. He reports that he drinks alcohol.   Family History:  The patient's  family history is not on file.    ROS:  Please see the history of present illness.     Patient denies fever, chills, headache, sweats, rash, change in vision, change in hearing, chest pain, cough, nausea or vomiting, urinary symptoms. All other systems are reviewed and are negative.   PHYSICAL EXAM: VS:  BP 132/78 mmHg  Pulse 74  Ht '5\' 6"'$  (1.676 m)  Wt 232 lb (105.235 kg)  BMI 37.46 kg/m2  SpO2 96% , patient is stable but overweight. He is oriented to person time and place. Affect is normal. Head is atraumatic. Sclera and conjunctival are normal. He has poor dentition. Lungs reveal scattered rhonchi. There is no respiratory distress. Cardiac exam reveals S1 and S2. The abdomen is benign. There is no peripheral edema. There are no musculoskeletal deformities. He does have ecchymoses on his arms.  EKG:   EKG is done today and reviewed by me. The EKG is normal. Recent Labs: No results found for requested labs within last 365 days.    Lipid Panel No results found for: CHOL, TRIG, HDL, CHOLHDL, VLDL, LDLCALC, LDLDIRECT    Wt Readings from Last 3 Encounters:  12/07/14 232 lb (105.235 kg)  11/17/13 234 lb (106.142 kg)  06/24/12 246 lb (111.585 kg)      Current medicines are reviewed  The patient understands his medications.     ASSESSMENT AND PLAN:

## 2014-12-07 NOTE — Assessment & Plan Note (Signed)
He has mild bilateral carotid disease by Doppler. I would suggest a follow-up Doppler in September, 2017

## 2014-12-07 NOTE — Assessment & Plan Note (Signed)
The patient is on guideline directed therapy.

## 2014-12-07 NOTE — Assessment & Plan Note (Signed)
Currently he is using Chantix. He is scheduled to stop smoking completely in the next week.

## 2014-12-07 NOTE — Assessment & Plan Note (Signed)
Coronary disease is stable. The patient had a drug-eluting stent to the ramus in 2010. He had a 50% LAD at that time. Nuclear scan in May, 2013 revealed no ischemia. He is not having any significant symptoms. He is on appropriate medications. No further workup.

## 2014-12-15 ENCOUNTER — Telehealth: Payer: Self-pay | Admitting: Cardiology

## 2014-12-15 NOTE — Telephone Encounter (Signed)
Spoke with patient about trying to schedule carotids that are due in Sept 2016. Patient stated that he was told by Dr Ron Parker that he did not need to have done again until next Sept 2017.

## 2014-12-23 ENCOUNTER — Other Ambulatory Visit: Payer: Self-pay | Admitting: Cardiology

## 2015-03-31 DIAGNOSIS — Z0000 Encounter for general adult medical examination without abnormal findings: Secondary | ICD-10-CM | POA: Diagnosis not present

## 2015-03-31 DIAGNOSIS — Z418 Encounter for other procedures for purposes other than remedying health state: Secondary | ICD-10-CM | POA: Diagnosis not present

## 2015-03-31 DIAGNOSIS — Z6837 Body mass index (BMI) 37.0-37.9, adult: Secondary | ICD-10-CM | POA: Diagnosis not present

## 2015-03-31 DIAGNOSIS — Z7189 Other specified counseling: Secondary | ICD-10-CM | POA: Diagnosis not present

## 2015-03-31 DIAGNOSIS — Z1389 Encounter for screening for other disorder: Secondary | ICD-10-CM | POA: Diagnosis not present

## 2015-03-31 DIAGNOSIS — E7800 Pure hypercholesterolemia, unspecified: Secondary | ICD-10-CM | POA: Diagnosis not present

## 2015-03-31 DIAGNOSIS — E039 Hypothyroidism, unspecified: Secondary | ICD-10-CM | POA: Diagnosis not present

## 2015-04-04 DIAGNOSIS — E039 Hypothyroidism, unspecified: Secondary | ICD-10-CM | POA: Diagnosis not present

## 2015-04-04 DIAGNOSIS — Z79899 Other long term (current) drug therapy: Secondary | ICD-10-CM | POA: Diagnosis not present

## 2015-04-04 DIAGNOSIS — E7800 Pure hypercholesterolemia, unspecified: Secondary | ICD-10-CM | POA: Diagnosis not present

## 2015-06-13 DIAGNOSIS — M5030 Other cervical disc degeneration, unspecified cervical region: Secondary | ICD-10-CM | POA: Diagnosis not present

## 2015-06-13 DIAGNOSIS — M4722 Other spondylosis with radiculopathy, cervical region: Secondary | ICD-10-CM | POA: Diagnosis not present

## 2015-06-14 DIAGNOSIS — E1165 Type 2 diabetes mellitus with hyperglycemia: Secondary | ICD-10-CM | POA: Diagnosis not present

## 2015-06-14 DIAGNOSIS — F17200 Nicotine dependence, unspecified, uncomplicated: Secondary | ICD-10-CM | POA: Diagnosis not present

## 2015-06-23 ENCOUNTER — Other Ambulatory Visit: Payer: Self-pay | Admitting: Cardiology

## 2015-06-27 DIAGNOSIS — M4722 Other spondylosis with radiculopathy, cervical region: Secondary | ICD-10-CM | POA: Diagnosis not present

## 2015-06-27 DIAGNOSIS — M5022 Other cervical disc displacement, mid-cervical region, unspecified level: Secondary | ICD-10-CM | POA: Diagnosis not present

## 2015-07-25 DIAGNOSIS — M791 Myalgia: Secondary | ICD-10-CM | POA: Diagnosis not present

## 2015-07-25 DIAGNOSIS — M4722 Other spondylosis with radiculopathy, cervical region: Secondary | ICD-10-CM | POA: Diagnosis not present

## 2015-07-25 DIAGNOSIS — M5030 Other cervical disc degeneration, unspecified cervical region: Secondary | ICD-10-CM | POA: Diagnosis not present

## 2015-08-10 DIAGNOSIS — E7800 Pure hypercholesterolemia, unspecified: Secondary | ICD-10-CM | POA: Diagnosis not present

## 2015-08-10 DIAGNOSIS — I10 Essential (primary) hypertension: Secondary | ICD-10-CM | POA: Diagnosis not present

## 2015-08-10 DIAGNOSIS — E119 Type 2 diabetes mellitus without complications: Secondary | ICD-10-CM | POA: Diagnosis not present

## 2015-08-16 DIAGNOSIS — E1165 Type 2 diabetes mellitus with hyperglycemia: Secondary | ICD-10-CM | POA: Diagnosis not present

## 2015-08-16 DIAGNOSIS — I10 Essential (primary) hypertension: Secondary | ICD-10-CM | POA: Diagnosis not present

## 2015-08-16 DIAGNOSIS — L03119 Cellulitis of unspecified part of limb: Secondary | ICD-10-CM | POA: Diagnosis not present

## 2015-09-20 DIAGNOSIS — I10 Essential (primary) hypertension: Secondary | ICD-10-CM | POA: Diagnosis not present

## 2015-09-20 DIAGNOSIS — E7800 Pure hypercholesterolemia, unspecified: Secondary | ICD-10-CM | POA: Diagnosis not present

## 2015-09-20 DIAGNOSIS — Z299 Encounter for prophylactic measures, unspecified: Secondary | ICD-10-CM | POA: Diagnosis not present

## 2015-09-20 DIAGNOSIS — E1165 Type 2 diabetes mellitus with hyperglycemia: Secondary | ICD-10-CM | POA: Diagnosis not present

## 2015-10-24 DIAGNOSIS — M47816 Spondylosis without myelopathy or radiculopathy, lumbar region: Secondary | ICD-10-CM | POA: Diagnosis not present

## 2015-10-24 DIAGNOSIS — M4722 Other spondylosis with radiculopathy, cervical region: Secondary | ICD-10-CM | POA: Diagnosis not present

## 2015-10-30 DIAGNOSIS — I10 Essential (primary) hypertension: Secondary | ICD-10-CM | POA: Diagnosis not present

## 2015-10-30 DIAGNOSIS — E7800 Pure hypercholesterolemia, unspecified: Secondary | ICD-10-CM | POA: Diagnosis not present

## 2015-10-30 DIAGNOSIS — E119 Type 2 diabetes mellitus without complications: Secondary | ICD-10-CM | POA: Diagnosis not present

## 2015-11-11 DIAGNOSIS — M545 Low back pain: Secondary | ICD-10-CM | POA: Diagnosis not present

## 2015-11-11 DIAGNOSIS — M47816 Spondylosis without myelopathy or radiculopathy, lumbar region: Secondary | ICD-10-CM | POA: Diagnosis not present

## 2015-11-11 DIAGNOSIS — S39012A Strain of muscle, fascia and tendon of lower back, initial encounter: Secondary | ICD-10-CM | POA: Diagnosis not present

## 2015-11-11 DIAGNOSIS — Z7984 Long term (current) use of oral hypoglycemic drugs: Secondary | ICD-10-CM | POA: Diagnosis not present

## 2015-11-11 DIAGNOSIS — I10 Essential (primary) hypertension: Secondary | ICD-10-CM | POA: Diagnosis not present

## 2015-11-11 DIAGNOSIS — M4306 Spondylolysis, lumbar region: Secondary | ICD-10-CM | POA: Diagnosis not present

## 2015-11-11 DIAGNOSIS — F17200 Nicotine dependence, unspecified, uncomplicated: Secondary | ICD-10-CM | POA: Diagnosis not present

## 2015-11-11 DIAGNOSIS — I2510 Atherosclerotic heart disease of native coronary artery without angina pectoris: Secondary | ICD-10-CM | POA: Diagnosis not present

## 2015-11-11 DIAGNOSIS — X58XXXA Exposure to other specified factors, initial encounter: Secondary | ICD-10-CM | POA: Diagnosis not present

## 2015-11-11 DIAGNOSIS — E039 Hypothyroidism, unspecified: Secondary | ICD-10-CM | POA: Diagnosis not present

## 2015-11-11 DIAGNOSIS — Z79899 Other long term (current) drug therapy: Secondary | ICD-10-CM | POA: Diagnosis not present

## 2015-11-11 DIAGNOSIS — E119 Type 2 diabetes mellitus without complications: Secondary | ICD-10-CM | POA: Diagnosis not present

## 2015-11-14 DIAGNOSIS — M47816 Spondylosis without myelopathy or radiculopathy, lumbar region: Secondary | ICD-10-CM | POA: Diagnosis not present

## 2015-11-14 DIAGNOSIS — M62830 Muscle spasm of back: Secondary | ICD-10-CM | POA: Diagnosis not present

## 2015-12-12 ENCOUNTER — Encounter: Payer: Self-pay | Admitting: Cardiology

## 2015-12-12 DIAGNOSIS — M47816 Spondylosis without myelopathy or radiculopathy, lumbar region: Secondary | ICD-10-CM | POA: Diagnosis not present

## 2015-12-12 NOTE — Progress Notes (Signed)
Cardiology Office Note  Date: 12/13/2015   ID: Malik Booker, DOB Apr 05, 1960, MRN [id number]  PCP: Monico Blitz, MD  Evaluating Cardiologist: Rozann Lesches, MD   Chief Complaint  Patient presents with  . Coronary Artery Disease    History of Present Illness: Malik Booker is a 55 y.o. male former patient of Dr. Ron Parker last seen in September 2016, now establishing follow-up with me. This is our first meeting today. I reviewed records and updated his chart. He presents for a routine follow-up visit, denies any significant angina symptoms or nitroglycerin use. He is a retired Administrator. He does not report any regular exercise but is functional in his ADLs.  I reviewed his current medications. Cardiac regimen is outlined below, he reports no changes. States that he had lab work at [facility] earlier this year.  Cardiolite from 2013 is outlined below. I reviewed his ECG today which shows sinus rhythm with borderline low voltage.  Past Medical History:  Diagnosis Date  . CAD (coronary artery disease)    50% LAD, DES to ramus, August 2010  . Claudication, intermittent ([place])   . Dyslipidemia   . Ectopic atrial tachycardia ([place])   . Essential hypertension   . Type 2 diabetes mellitus ([place])     Past Surgical History:  Procedure Laterality Date  . CARDIAC CATHETERIZATION  11/10/08, 11/15/08   DES to ramus  . TONSILLECTOMY      Current Outpatient Prescriptions  Medication Sig Dispense Refill  . amLODipine-benazepril (LOTREL) 10-20 MG per capsule Take 1 capsule by mouth daily.      Marland Kitchen aspirin EC 81 MG tablet Take 1 tablet (81 mg total) by mouth daily.    . Canagliflozin (INVOKANA) 300 MG TABS Take 1 tablet by mouth daily.    . clonazePAM (KLONOPIN) 0.5 MG tablet Take 0.5 mg by mouth 2 (two) times daily.     . clopidogrel (PLAVIX) 75 MG tablet Take 75 mg by mouth daily.      . diclofenac (VOLTAREN) 75 MG EC tablet Take 1 tablet by mouth 2 (two) times daily.    Marland Kitchen docusate  sodium (COLACE) 50 MG capsule Take by mouth daily as needed.     Marland Kitchen escitalopram (LEXAPRO) 10 MG tablet Take 1 tablet by mouth daily.    . furosemide (LASIX) 40 MG tablet Take 40 mg by mouth daily.      Marland Kitchen HYDROcodone-acetaminophen (NORCO) 10-325 MG tablet Take 1 tablet by mouth every 4 (four) hours as needed.    Marland Kitchen levothyroxine (SYNTHROID, LEVOTHROID) 175 MCG tablet Take 175 mcg by mouth daily before breakfast.    . metaxalone (SKELAXIN) 800 MG tablet Take 1 tablet by mouth 3 (three) times daily.    . metFORMIN (GLUCOPHAGE) 500 MG tablet Take 500 mg by mouth 2 (two) times daily with a meal.     . NIASPAN 1000 MG CR tablet Take 1 tablet by mouth Daily.    . nitroGLYCERIN (NITROSTAT) 0.4 MG SL tablet Place 0.4 mg under the tongue every 5 (five) minutes as needed for chest pain.    . pantoprazole (PROTONIX) 40 MG tablet Take 1 tablet by mouth Daily.    Marland Kitchen VICTOZA 18 MG/3ML SOPN Inject 0.2 mg into the skin daily.    Marland Kitchen atorvastatin (LIPITOR) 40 MG tablet Take 1 tablet (40 mg total) by mouth daily. 90 tablet 3  . metoprolol succinate (TOPROL-XL) 25 MG 24 hr tablet TAKE ONE TABLET BY MOUTH TWICE DAILY. (Patient not taking: Reported on  12/13/2015) 60 tablet 6   No current facility-administered medications for this visit.    Allergies:  Penicillins   Social History: The patient  reports that he has been smoking Cigarettes.  He started smoking about 45 years ago. He has a 60.00 pack-year smoking history. He has never used smokeless tobacco. He reports that he drinks alcohol.   Family History: The patient's family history includes Heart disease in his brother.   ROS:  Please see the history of present illness. Otherwise, complete review of systems is positive for chronic back and neck pain. All other systems are reviewed and negative.   Physical Exam: VS:  BP 116/75   Pulse 78   Ht '5\' 6"'$  (1.676 m)   Wt 220 lb (99.8 kg)   BMI 35.51 kg/m , BMI Body mass index is 35.51 kg/m.  Wt Readings from Last 3  Encounters:  12/13/15 220 lb (99.8 kg)  12/07/14 232 lb (105.2 kg)  11/17/13 234 lb (106.1 kg)    General: Overweight male, appears comfortable at rest. HEENT: Conjunctiva and lids normal, oropharynx clear. Neck: Supple, no elevated JVP, no thyromegaly. Lungs: Clear to auscultation, nonlabored breathing at rest. Cardiac: Regular rate and rhythm, no S3, soft systolic murmur, no pericardial rub. Abdomen: Soft, nontender, bowel sounds present, no guarding or rebound. Extremities: No pitting edema, distal pulses 2+. Skin: Warm and dry. Musculoskeletal: No kyphosis. Neuropsychiatric: Alert and oriented x3, affect grossly appropriate.  ECG: I personally reviewed the tracing from 12/07/2014 which showed sinus rhythm.  Other Studies Reviewed Today:  Lexiscan Cardiolite 07/24/2011 [facility]): No diagnostic ST segment changes, inferior attenuation artifact, LVEF 63%.  Carotid Dopplers 11/26/2013: 1-39% bilateral ICA stenoses.  Assessment and Plan:  1. Symptomatically stable CAD with prior DES to the ramus and moderate LAD disease that was managed medically as of 2010. He had a low risk Cardiolite study in 2013. I reviewed his ECG today. Plan will be continued observation. We will consider follow-up ischemic testing around the time of his next visit.  2. Essential hypertension, blood pressure is well controlled today.  3. Reported history of ectopic atrial tachycardia, I do not have all the details on this. He does not report any palpitations and is on beta blocker therapy.  4. Hyperlipidemia, on Lipitor. He follows with Dr. Manuella Ghazi. Requesting most recent lab work.  Current medicines were reviewed with the patient today.   Orders Placed This Encounter  Procedures  . EKG 12-Lead    Disposition: Follow-up with me in one year, sooner if needed.  Signed, Satira Sark, MD, [facility] 12/13/2015 10:38 AM    [place] [facility] [place], [place], [place]  [address] Phone: [phone]; Fax: [phone]

## 2015-12-13 ENCOUNTER — Ambulatory Visit (INDEPENDENT_AMBULATORY_CARE_PROVIDER_SITE_OTHER): Payer: PPO | Admitting: Cardiology

## 2015-12-13 ENCOUNTER — Encounter: Payer: Self-pay | Admitting: Cardiology

## 2015-12-13 ENCOUNTER — Encounter: Payer: Self-pay | Admitting: *Deleted

## 2015-12-13 VITALS — BP 116/75 | HR 78 | Ht 66.0 in | Wt 220.0 lb

## 2015-12-13 DIAGNOSIS — I10 Essential (primary) hypertension: Secondary | ICD-10-CM

## 2015-12-13 DIAGNOSIS — E785 Hyperlipidemia, unspecified: Secondary | ICD-10-CM

## 2015-12-13 DIAGNOSIS — Z8679 Personal history of other diseases of the circulatory system: Secondary | ICD-10-CM

## 2015-12-13 DIAGNOSIS — I2510 Atherosclerotic heart disease of native coronary artery without angina pectoris: Secondary | ICD-10-CM

## 2015-12-13 NOTE — Patient Instructions (Addendum)

## 2015-12-27 DIAGNOSIS — E1165 Type 2 diabetes mellitus with hyperglycemia: Secondary | ICD-10-CM | POA: Diagnosis not present

## 2015-12-27 DIAGNOSIS — Z713 Dietary counseling and surveillance: Secondary | ICD-10-CM | POA: Diagnosis not present

## 2015-12-27 DIAGNOSIS — F329 Major depressive disorder, single episode, unspecified: Secondary | ICD-10-CM | POA: Diagnosis not present

## 2015-12-27 DIAGNOSIS — Z6832 Body mass index (BMI) 32.0-32.9, adult: Secondary | ICD-10-CM | POA: Diagnosis not present

## 2015-12-27 DIAGNOSIS — Z299 Encounter for prophylactic measures, unspecified: Secondary | ICD-10-CM | POA: Diagnosis not present

## 2016-01-24 DIAGNOSIS — M47816 Spondylosis without myelopathy or radiculopathy, lumbar region: Secondary | ICD-10-CM | POA: Diagnosis not present

## 2016-01-24 DIAGNOSIS — M4722 Other spondylosis with radiculopathy, cervical region: Secondary | ICD-10-CM | POA: Diagnosis not present

## 2016-01-29 DIAGNOSIS — E1122 Type 2 diabetes mellitus with diabetic chronic kidney disease: Secondary | ICD-10-CM | POA: Diagnosis not present

## 2016-01-29 DIAGNOSIS — F329 Major depressive disorder, single episode, unspecified: Secondary | ICD-10-CM | POA: Diagnosis not present

## 2016-01-29 DIAGNOSIS — R220 Localized swelling, mass and lump, head: Secondary | ICD-10-CM | POA: Diagnosis not present

## 2016-01-29 DIAGNOSIS — Z6832 Body mass index (BMI) 32.0-32.9, adult: Secondary | ICD-10-CM | POA: Diagnosis not present

## 2016-01-29 DIAGNOSIS — Z299 Encounter for prophylactic measures, unspecified: Secondary | ICD-10-CM | POA: Diagnosis not present

## 2016-01-29 DIAGNOSIS — I10 Essential (primary) hypertension: Secondary | ICD-10-CM | POA: Diagnosis not present

## 2016-02-01 DIAGNOSIS — I10 Essential (primary) hypertension: Secondary | ICD-10-CM | POA: Diagnosis not present

## 2016-02-01 DIAGNOSIS — E7800 Pure hypercholesterolemia, unspecified: Secondary | ICD-10-CM | POA: Diagnosis not present

## 2016-02-01 DIAGNOSIS — E119 Type 2 diabetes mellitus without complications: Secondary | ICD-10-CM | POA: Diagnosis not present

## 2016-02-16 ENCOUNTER — Other Ambulatory Visit: Payer: Self-pay | Admitting: Cardiology

## 2016-03-22 DIAGNOSIS — E119 Type 2 diabetes mellitus without complications: Secondary | ICD-10-CM | POA: Diagnosis not present

## 2016-03-22 DIAGNOSIS — E7800 Pure hypercholesterolemia, unspecified: Secondary | ICD-10-CM | POA: Diagnosis not present

## 2016-03-22 DIAGNOSIS — I10 Essential (primary) hypertension: Secondary | ICD-10-CM | POA: Diagnosis not present

## 2016-04-03 DIAGNOSIS — F329 Major depressive disorder, single episode, unspecified: Secondary | ICD-10-CM | POA: Diagnosis not present

## 2016-04-03 DIAGNOSIS — N181 Chronic kidney disease, stage 1: Secondary | ICD-10-CM | POA: Diagnosis not present

## 2016-04-03 DIAGNOSIS — Z6833 Body mass index (BMI) 33.0-33.9, adult: Secondary | ICD-10-CM | POA: Diagnosis not present

## 2016-04-03 DIAGNOSIS — E1122 Type 2 diabetes mellitus with diabetic chronic kidney disease: Secondary | ICD-10-CM | POA: Diagnosis not present

## 2016-04-03 DIAGNOSIS — Z87891 Personal history of nicotine dependence: Secondary | ICD-10-CM | POA: Diagnosis not present

## 2016-04-03 DIAGNOSIS — Z299 Encounter for prophylactic measures, unspecified: Secondary | ICD-10-CM | POA: Diagnosis not present

## 2016-04-03 DIAGNOSIS — E7800 Pure hypercholesterolemia, unspecified: Secondary | ICD-10-CM | POA: Diagnosis not present

## 2016-04-26 DIAGNOSIS — Z79891 Long term (current) use of opiate analgesic: Secondary | ICD-10-CM | POA: Diagnosis not present

## 2016-04-26 DIAGNOSIS — M47816 Spondylosis without myelopathy or radiculopathy, lumbar region: Secondary | ICD-10-CM | POA: Diagnosis not present

## 2016-04-26 DIAGNOSIS — M4722 Other spondylosis with radiculopathy, cervical region: Secondary | ICD-10-CM | POA: Diagnosis not present

## 2016-05-08 DIAGNOSIS — N181 Chronic kidney disease, stage 1: Secondary | ICD-10-CM | POA: Diagnosis not present

## 2016-05-08 DIAGNOSIS — Z299 Encounter for prophylactic measures, unspecified: Secondary | ICD-10-CM | POA: Diagnosis not present

## 2016-05-08 DIAGNOSIS — F329 Major depressive disorder, single episode, unspecified: Secondary | ICD-10-CM | POA: Diagnosis not present

## 2016-05-08 DIAGNOSIS — E7800 Pure hypercholesterolemia, unspecified: Secondary | ICD-10-CM | POA: Diagnosis not present

## 2016-05-08 DIAGNOSIS — Z125 Encounter for screening for malignant neoplasm of prostate: Secondary | ICD-10-CM | POA: Diagnosis not present

## 2016-05-08 DIAGNOSIS — E1122 Type 2 diabetes mellitus with diabetic chronic kidney disease: Secondary | ICD-10-CM | POA: Diagnosis not present

## 2016-05-08 DIAGNOSIS — I10 Essential (primary) hypertension: Secondary | ICD-10-CM | POA: Diagnosis not present

## 2016-05-08 DIAGNOSIS — I739 Peripheral vascular disease, unspecified: Secondary | ICD-10-CM | POA: Diagnosis not present

## 2016-05-08 DIAGNOSIS — Z79899 Other long term (current) drug therapy: Secondary | ICD-10-CM | POA: Diagnosis not present

## 2016-05-08 DIAGNOSIS — Z0000 Encounter for general adult medical examination without abnormal findings: Secondary | ICD-10-CM | POA: Diagnosis not present

## 2016-05-08 DIAGNOSIS — Z1389 Encounter for screening for other disorder: Secondary | ICD-10-CM | POA: Diagnosis not present

## 2016-05-08 DIAGNOSIS — Z7189 Other specified counseling: Secondary | ICD-10-CM | POA: Diagnosis not present

## 2016-05-08 DIAGNOSIS — K219 Gastro-esophageal reflux disease without esophagitis: Secondary | ICD-10-CM | POA: Diagnosis not present

## 2016-05-08 DIAGNOSIS — E039 Hypothyroidism, unspecified: Secondary | ICD-10-CM | POA: Diagnosis not present

## 2016-05-13 DIAGNOSIS — E119 Type 2 diabetes mellitus without complications: Secondary | ICD-10-CM | POA: Diagnosis not present

## 2016-05-13 DIAGNOSIS — E7800 Pure hypercholesterolemia, unspecified: Secondary | ICD-10-CM | POA: Diagnosis not present

## 2016-05-13 DIAGNOSIS — I10 Essential (primary) hypertension: Secondary | ICD-10-CM | POA: Diagnosis not present

## 2016-05-20 ENCOUNTER — Other Ambulatory Visit: Payer: Self-pay | Admitting: Cardiology

## 2016-06-12 DIAGNOSIS — I739 Peripheral vascular disease, unspecified: Secondary | ICD-10-CM | POA: Diagnosis not present

## 2016-06-12 DIAGNOSIS — Z1211 Encounter for screening for malignant neoplasm of colon: Secondary | ICD-10-CM | POA: Diagnosis not present

## 2016-06-12 DIAGNOSIS — F17210 Nicotine dependence, cigarettes, uncomplicated: Secondary | ICD-10-CM | POA: Diagnosis not present

## 2016-06-12 DIAGNOSIS — N189 Chronic kidney disease, unspecified: Secondary | ICD-10-CM | POA: Diagnosis not present

## 2016-06-12 DIAGNOSIS — I2510 Atherosclerotic heart disease of native coronary artery without angina pectoris: Secondary | ICD-10-CM | POA: Diagnosis not present

## 2016-06-12 DIAGNOSIS — I129 Hypertensive chronic kidney disease with stage 1 through stage 4 chronic kidney disease, or unspecified chronic kidney disease: Secondary | ICD-10-CM | POA: Diagnosis not present

## 2016-06-12 DIAGNOSIS — Z880 Allergy status to penicillin: Secondary | ICD-10-CM | POA: Diagnosis not present

## 2016-06-12 DIAGNOSIS — E1122 Type 2 diabetes mellitus with diabetic chronic kidney disease: Secondary | ICD-10-CM | POA: Diagnosis not present

## 2016-06-12 DIAGNOSIS — I10 Essential (primary) hypertension: Secondary | ICD-10-CM | POA: Diagnosis not present

## 2016-06-12 DIAGNOSIS — Z8249 Family history of ischemic heart disease and other diseases of the circulatory system: Secondary | ICD-10-CM | POA: Diagnosis not present

## 2016-06-12 DIAGNOSIS — E119 Type 2 diabetes mellitus without complications: Secondary | ICD-10-CM | POA: Diagnosis not present

## 2016-06-12 DIAGNOSIS — E039 Hypothyroidism, unspecified: Secondary | ICD-10-CM | POA: Diagnosis not present

## 2016-06-12 DIAGNOSIS — Z833 Family history of diabetes mellitus: Secondary | ICD-10-CM | POA: Diagnosis not present

## 2016-06-12 DIAGNOSIS — K219 Gastro-esophageal reflux disease without esophagitis: Secondary | ICD-10-CM | POA: Diagnosis not present

## 2016-07-05 DIAGNOSIS — Z299 Encounter for prophylactic measures, unspecified: Secondary | ICD-10-CM | POA: Diagnosis not present

## 2016-07-05 DIAGNOSIS — K219 Gastro-esophageal reflux disease without esophagitis: Secondary | ICD-10-CM | POA: Diagnosis not present

## 2016-07-05 DIAGNOSIS — Z6833 Body mass index (BMI) 33.0-33.9, adult: Secondary | ICD-10-CM | POA: Diagnosis not present

## 2016-07-05 DIAGNOSIS — N181 Chronic kidney disease, stage 1: Secondary | ICD-10-CM | POA: Diagnosis not present

## 2016-07-05 DIAGNOSIS — E1122 Type 2 diabetes mellitus with diabetic chronic kidney disease: Secondary | ICD-10-CM | POA: Diagnosis not present

## 2016-07-05 DIAGNOSIS — Z79899 Other long term (current) drug therapy: Secondary | ICD-10-CM | POA: Diagnosis not present

## 2016-07-05 DIAGNOSIS — I10 Essential (primary) hypertension: Secondary | ICD-10-CM | POA: Diagnosis not present

## 2016-07-05 DIAGNOSIS — M549 Dorsalgia, unspecified: Secondary | ICD-10-CM | POA: Diagnosis not present

## 2016-07-05 DIAGNOSIS — E7800 Pure hypercholesterolemia, unspecified: Secondary | ICD-10-CM | POA: Diagnosis not present

## 2016-07-05 DIAGNOSIS — I739 Peripheral vascular disease, unspecified: Secondary | ICD-10-CM | POA: Diagnosis not present

## 2016-07-05 DIAGNOSIS — Z87891 Personal history of nicotine dependence: Secondary | ICD-10-CM | POA: Diagnosis not present

## 2016-07-05 DIAGNOSIS — G4700 Insomnia, unspecified: Secondary | ICD-10-CM | POA: Diagnosis not present

## 2016-07-09 DIAGNOSIS — E7800 Pure hypercholesterolemia, unspecified: Secondary | ICD-10-CM | POA: Diagnosis not present

## 2016-07-09 DIAGNOSIS — I10 Essential (primary) hypertension: Secondary | ICD-10-CM | POA: Diagnosis not present

## 2016-07-09 DIAGNOSIS — E119 Type 2 diabetes mellitus without complications: Secondary | ICD-10-CM | POA: Diagnosis not present

## 2016-07-23 DIAGNOSIS — C3411 Malignant neoplasm of upper lobe, right bronchus or lung: Secondary | ICD-10-CM | POA: Diagnosis not present

## 2016-07-23 DIAGNOSIS — C7931 Secondary malignant neoplasm of brain: Secondary | ICD-10-CM | POA: Diagnosis not present

## 2016-07-23 DIAGNOSIS — Z87891 Personal history of nicotine dependence: Secondary | ICD-10-CM | POA: Diagnosis not present

## 2016-07-24 DIAGNOSIS — M47816 Spondylosis without myelopathy or radiculopathy, lumbar region: Secondary | ICD-10-CM | POA: Diagnosis not present

## 2016-07-24 DIAGNOSIS — M4722 Other spondylosis with radiculopathy, cervical region: Secondary | ICD-10-CM | POA: Diagnosis not present

## 2016-07-31 DIAGNOSIS — I10 Essential (primary) hypertension: Secondary | ICD-10-CM | POA: Diagnosis not present

## 2016-07-31 DIAGNOSIS — I739 Peripheral vascular disease, unspecified: Secondary | ICD-10-CM | POA: Diagnosis not present

## 2016-07-31 DIAGNOSIS — R0602 Shortness of breath: Secondary | ICD-10-CM | POA: Diagnosis not present

## 2016-07-31 DIAGNOSIS — R05 Cough: Secondary | ICD-10-CM | POA: Diagnosis not present

## 2016-07-31 DIAGNOSIS — R918 Other nonspecific abnormal finding of lung field: Secondary | ICD-10-CM | POA: Diagnosis not present

## 2016-07-31 DIAGNOSIS — K219 Gastro-esophageal reflux disease without esophagitis: Secondary | ICD-10-CM | POA: Diagnosis not present

## 2016-07-31 DIAGNOSIS — Z6835 Body mass index (BMI) 35.0-35.9, adult: Secondary | ICD-10-CM | POA: Diagnosis not present

## 2016-07-31 DIAGNOSIS — Z888 Allergy status to other drugs, medicaments and biological substances status: Secondary | ICD-10-CM | POA: Diagnosis not present

## 2016-07-31 DIAGNOSIS — Z79899 Other long term (current) drug therapy: Secondary | ICD-10-CM | POA: Diagnosis not present

## 2016-07-31 DIAGNOSIS — F329 Major depressive disorder, single episode, unspecified: Secondary | ICD-10-CM | POA: Diagnosis not present

## 2016-07-31 DIAGNOSIS — R609 Edema, unspecified: Secondary | ICD-10-CM | POA: Diagnosis not present

## 2016-07-31 DIAGNOSIS — Z7984 Long term (current) use of oral hypoglycemic drugs: Secondary | ICD-10-CM | POA: Diagnosis not present

## 2016-07-31 DIAGNOSIS — Z7902 Long term (current) use of antithrombotics/antiplatelets: Secondary | ICD-10-CM | POA: Diagnosis not present

## 2016-07-31 DIAGNOSIS — F17200 Nicotine dependence, unspecified, uncomplicated: Secondary | ICD-10-CM | POA: Diagnosis not present

## 2016-07-31 DIAGNOSIS — R0600 Dyspnea, unspecified: Secondary | ICD-10-CM | POA: Diagnosis not present

## 2016-07-31 DIAGNOSIS — E7800 Pure hypercholesterolemia, unspecified: Secondary | ICD-10-CM | POA: Diagnosis not present

## 2016-07-31 DIAGNOSIS — E119 Type 2 diabetes mellitus without complications: Secondary | ICD-10-CM | POA: Diagnosis not present

## 2016-07-31 DIAGNOSIS — R590 Localized enlarged lymph nodes: Secondary | ICD-10-CM | POA: Diagnosis not present

## 2016-08-01 DIAGNOSIS — E119 Type 2 diabetes mellitus without complications: Secondary | ICD-10-CM | POA: Diagnosis not present

## 2016-08-01 DIAGNOSIS — R918 Other nonspecific abnormal finding of lung field: Secondary | ICD-10-CM | POA: Diagnosis not present

## 2016-08-01 DIAGNOSIS — C3490 Malignant neoplasm of unspecified part of unspecified bronchus or lung: Secondary | ICD-10-CM | POA: Diagnosis not present

## 2016-08-01 DIAGNOSIS — R0602 Shortness of breath: Secondary | ICD-10-CM | POA: Diagnosis not present

## 2016-08-01 DIAGNOSIS — R590 Localized enlarged lymph nodes: Secondary | ICD-10-CM | POA: Diagnosis not present

## 2016-08-01 DIAGNOSIS — I10 Essential (primary) hypertension: Secondary | ICD-10-CM | POA: Diagnosis not present

## 2016-08-01 DIAGNOSIS — J90 Pleural effusion, not elsewhere classified: Secondary | ICD-10-CM | POA: Diagnosis not present

## 2016-08-01 DIAGNOSIS — I700 Atherosclerosis of aorta: Secondary | ICD-10-CM | POA: Diagnosis not present

## 2016-08-01 DIAGNOSIS — C787 Secondary malignant neoplasm of liver and intrahepatic bile duct: Secondary | ICD-10-CM | POA: Diagnosis not present

## 2016-08-02 DIAGNOSIS — C3411 Malignant neoplasm of upper lobe, right bronchus or lung: Secondary | ICD-10-CM | POA: Diagnosis not present

## 2016-08-05 DIAGNOSIS — K7689 Other specified diseases of liver: Secondary | ICD-10-CM | POA: Diagnosis not present

## 2016-08-05 DIAGNOSIS — Z7902 Long term (current) use of antithrombotics/antiplatelets: Secondary | ICD-10-CM | POA: Diagnosis not present

## 2016-08-05 DIAGNOSIS — C229 Malignant neoplasm of liver, not specified as primary or secondary: Secondary | ICD-10-CM | POA: Diagnosis not present

## 2016-08-05 DIAGNOSIS — K769 Liver disease, unspecified: Secondary | ICD-10-CM | POA: Diagnosis not present

## 2016-08-06 DIAGNOSIS — C711 Malignant neoplasm of frontal lobe: Secondary | ICD-10-CM | POA: Diagnosis not present

## 2016-08-06 DIAGNOSIS — C787 Secondary malignant neoplasm of liver and intrahepatic bile duct: Secondary | ICD-10-CM | POA: Diagnosis not present

## 2016-08-07 DIAGNOSIS — I10 Essential (primary) hypertension: Secondary | ICD-10-CM | POA: Diagnosis not present

## 2016-08-07 DIAGNOSIS — Z6834 Body mass index (BMI) 34.0-34.9, adult: Secondary | ICD-10-CM | POA: Diagnosis not present

## 2016-08-07 DIAGNOSIS — E1165 Type 2 diabetes mellitus with hyperglycemia: Secondary | ICD-10-CM | POA: Diagnosis not present

## 2016-08-07 DIAGNOSIS — F329 Major depressive disorder, single episode, unspecified: Secondary | ICD-10-CM | POA: Diagnosis not present

## 2016-08-07 DIAGNOSIS — M549 Dorsalgia, unspecified: Secondary | ICD-10-CM | POA: Diagnosis not present

## 2016-08-07 DIAGNOSIS — Z510 Encounter for antineoplastic radiation therapy: Secondary | ICD-10-CM | POA: Diagnosis not present

## 2016-08-07 DIAGNOSIS — Z299 Encounter for prophylactic measures, unspecified: Secondary | ICD-10-CM | POA: Diagnosis not present

## 2016-08-07 DIAGNOSIS — C3490 Malignant neoplasm of unspecified part of unspecified bronchus or lung: Secondary | ICD-10-CM | POA: Diagnosis not present

## 2016-08-07 DIAGNOSIS — C7931 Secondary malignant neoplasm of brain: Secondary | ICD-10-CM | POA: Diagnosis not present

## 2016-08-08 DIAGNOSIS — L03116 Cellulitis of left lower limb: Secondary | ICD-10-CM | POA: Diagnosis not present

## 2016-08-08 DIAGNOSIS — S99922A Unspecified injury of left foot, initial encounter: Secondary | ICD-10-CM | POA: Diagnosis not present

## 2016-08-08 DIAGNOSIS — W102XXA Fall (on)(from) incline, initial encounter: Secondary | ICD-10-CM | POA: Diagnosis not present

## 2016-08-08 DIAGNOSIS — S90812A Abrasion, left foot, initial encounter: Secondary | ICD-10-CM | POA: Diagnosis not present

## 2016-08-08 DIAGNOSIS — E119 Type 2 diabetes mellitus without complications: Secondary | ICD-10-CM | POA: Diagnosis not present

## 2016-08-08 DIAGNOSIS — C7931 Secondary malignant neoplasm of brain: Secondary | ICD-10-CM | POA: Diagnosis not present

## 2016-08-08 DIAGNOSIS — C3411 Malignant neoplasm of upper lobe, right bronchus or lung: Secondary | ICD-10-CM | POA: Diagnosis not present

## 2016-08-08 DIAGNOSIS — M7989 Other specified soft tissue disorders: Secondary | ICD-10-CM | POA: Diagnosis not present

## 2016-08-09 DIAGNOSIS — Z6836 Body mass index (BMI) 36.0-36.9, adult: Secondary | ICD-10-CM | POA: Diagnosis not present

## 2016-08-09 DIAGNOSIS — K219 Gastro-esophageal reflux disease without esophagitis: Secondary | ICD-10-CM | POA: Diagnosis not present

## 2016-08-09 DIAGNOSIS — E119 Type 2 diabetes mellitus without complications: Secondary | ICD-10-CM | POA: Diagnosis not present

## 2016-08-09 DIAGNOSIS — S90812A Abrasion, left foot, initial encounter: Secondary | ICD-10-CM | POA: Diagnosis not present

## 2016-08-09 DIAGNOSIS — M7989 Other specified soft tissue disorders: Secondary | ICD-10-CM | POA: Diagnosis not present

## 2016-08-09 DIAGNOSIS — S91312A Laceration without foreign body, left foot, initial encounter: Secondary | ICD-10-CM | POA: Diagnosis not present

## 2016-08-09 DIAGNOSIS — Z7902 Long term (current) use of antithrombotics/antiplatelets: Secondary | ICD-10-CM | POA: Diagnosis not present

## 2016-08-09 DIAGNOSIS — F419 Anxiety disorder, unspecified: Secondary | ICD-10-CM | POA: Diagnosis not present

## 2016-08-09 DIAGNOSIS — W102XXA Fall (on)(from) incline, initial encounter: Secondary | ICD-10-CM | POA: Diagnosis not present

## 2016-08-09 DIAGNOSIS — Z87891 Personal history of nicotine dependence: Secondary | ICD-10-CM | POA: Diagnosis not present

## 2016-08-09 DIAGNOSIS — I10 Essential (primary) hypertension: Secondary | ICD-10-CM | POA: Diagnosis not present

## 2016-08-09 DIAGNOSIS — Z880 Allergy status to penicillin: Secondary | ICD-10-CM | POA: Diagnosis not present

## 2016-08-09 DIAGNOSIS — Z794 Long term (current) use of insulin: Secondary | ICD-10-CM | POA: Diagnosis not present

## 2016-08-09 DIAGNOSIS — C7931 Secondary malignant neoplasm of brain: Secondary | ICD-10-CM | POA: Diagnosis not present

## 2016-08-09 DIAGNOSIS — Y9301 Activity, walking, marching and hiking: Secondary | ICD-10-CM | POA: Diagnosis not present

## 2016-08-09 DIAGNOSIS — E1151 Type 2 diabetes mellitus with diabetic peripheral angiopathy without gangrene: Secondary | ICD-10-CM | POA: Diagnosis not present

## 2016-08-09 DIAGNOSIS — Z79899 Other long term (current) drug therapy: Secondary | ICD-10-CM | POA: Diagnosis not present

## 2016-08-09 DIAGNOSIS — F329 Major depressive disorder, single episode, unspecified: Secondary | ICD-10-CM | POA: Diagnosis not present

## 2016-08-09 DIAGNOSIS — C3490 Malignant neoplasm of unspecified part of unspecified bronchus or lung: Secondary | ICD-10-CM | POA: Diagnosis not present

## 2016-08-09 DIAGNOSIS — S99922A Unspecified injury of left foot, initial encounter: Secondary | ICD-10-CM | POA: Diagnosis not present

## 2016-08-09 DIAGNOSIS — E7800 Pure hypercholesterolemia, unspecified: Secondary | ICD-10-CM | POA: Diagnosis not present

## 2016-08-09 DIAGNOSIS — Y92007 Garden or yard of unspecified non-institutional (private) residence as the place of occurrence of the external cause: Secondary | ICD-10-CM | POA: Diagnosis not present

## 2016-08-09 DIAGNOSIS — M549 Dorsalgia, unspecified: Secondary | ICD-10-CM | POA: Diagnosis not present

## 2016-08-09 DIAGNOSIS — G8929 Other chronic pain: Secondary | ICD-10-CM | POA: Diagnosis not present

## 2016-08-09 DIAGNOSIS — L03116 Cellulitis of left lower limb: Secondary | ICD-10-CM | POA: Diagnosis not present

## 2016-08-09 DIAGNOSIS — E039 Hypothyroidism, unspecified: Secondary | ICD-10-CM | POA: Diagnosis not present

## 2016-08-10 DIAGNOSIS — E1151 Type 2 diabetes mellitus with diabetic peripheral angiopathy without gangrene: Secondary | ICD-10-CM | POA: Diagnosis not present

## 2016-08-10 DIAGNOSIS — C3490 Malignant neoplasm of unspecified part of unspecified bronchus or lung: Secondary | ICD-10-CM | POA: Diagnosis not present

## 2016-08-10 DIAGNOSIS — C7931 Secondary malignant neoplasm of brain: Secondary | ICD-10-CM | POA: Diagnosis not present

## 2016-08-10 DIAGNOSIS — L03116 Cellulitis of left lower limb: Secondary | ICD-10-CM | POA: Diagnosis not present

## 2016-08-11 DIAGNOSIS — E1151 Type 2 diabetes mellitus with diabetic peripheral angiopathy without gangrene: Secondary | ICD-10-CM | POA: Diagnosis not present

## 2016-08-11 DIAGNOSIS — L03116 Cellulitis of left lower limb: Secondary | ICD-10-CM | POA: Diagnosis not present

## 2016-08-11 DIAGNOSIS — C7931 Secondary malignant neoplasm of brain: Secondary | ICD-10-CM | POA: Diagnosis not present

## 2016-08-11 DIAGNOSIS — C3490 Malignant neoplasm of unspecified part of unspecified bronchus or lung: Secondary | ICD-10-CM | POA: Diagnosis not present

## 2016-08-23 DEATH — deceased
